# Patient Record
Sex: Female | Born: 1980 | Race: White | Hispanic: No | Marital: Married | Smoking: Never smoker
Health system: Southern US, Community
[De-identification: ages and names within clinical notes are randomized; demographics above are authoritative.]

## PROBLEM LIST (undated history)

## (undated) DIAGNOSIS — K219 Gastro-esophageal reflux disease without esophagitis: Secondary | ICD-10-CM

## (undated) DIAGNOSIS — R7989 Other specified abnormal findings of blood chemistry: Secondary | ICD-10-CM

## (undated) DIAGNOSIS — R030 Elevated blood-pressure reading, without diagnosis of hypertension: Secondary | ICD-10-CM

## (undated) DIAGNOSIS — G43909 Migraine, unspecified, not intractable, without status migrainosus: Secondary | ICD-10-CM

## (undated) DIAGNOSIS — G4700 Insomnia, unspecified: Secondary | ICD-10-CM

## (undated) DIAGNOSIS — F988 Other specified behavioral and emotional disorders with onset usually occurring in childhood and adolescence: Secondary | ICD-10-CM

---

## 2001-08-06 ENCOUNTER — Other Ambulatory Visit: Admission: RE | Admit: 2001-08-06 | Discharge: 2001-08-06 | Payer: Self-pay | Admitting: *Deleted

## 2002-03-06 ENCOUNTER — Emergency Department (HOSPITAL_COMMUNITY): Admission: EM | Admit: 2002-03-06 | Discharge: 2002-03-06 | Payer: Self-pay | Admitting: Emergency Medicine

## 2002-09-27 ENCOUNTER — Inpatient Hospital Stay (HOSPITAL_COMMUNITY): Admission: AD | Admit: 2002-09-27 | Discharge: 2002-09-27 | Payer: Self-pay | Admitting: Obstetrics & Gynecology

## 2002-10-07 ENCOUNTER — Other Ambulatory Visit: Admission: RE | Admit: 2002-10-07 | Discharge: 2002-10-07 | Payer: Self-pay | Admitting: Obstetrics and Gynecology

## 2002-12-10 ENCOUNTER — Encounter: Admission: RE | Admit: 2002-12-10 | Discharge: 2003-03-10 | Payer: Self-pay | Admitting: Obstetrics and Gynecology

## 2003-03-04 ENCOUNTER — Inpatient Hospital Stay (HOSPITAL_COMMUNITY): Admission: AD | Admit: 2003-03-04 | Discharge: 2003-03-04 | Payer: Self-pay | Admitting: Obstetrics and Gynecology

## 2003-03-05 ENCOUNTER — Inpatient Hospital Stay (HOSPITAL_COMMUNITY): Admission: AD | Admit: 2003-03-05 | Discharge: 2003-03-05 | Payer: Self-pay | Admitting: Obstetrics & Gynecology

## 2003-03-10 ENCOUNTER — Inpatient Hospital Stay (HOSPITAL_COMMUNITY): Admission: AD | Admit: 2003-03-10 | Discharge: 2003-03-12 | Payer: Self-pay | Admitting: Obstetrics and Gynecology

## 2003-03-11 ENCOUNTER — Encounter: Payer: Self-pay | Admitting: Obstetrics and Gynecology

## 2003-04-06 ENCOUNTER — Encounter: Payer: Self-pay | Admitting: Obstetrics and Gynecology

## 2003-04-06 ENCOUNTER — Inpatient Hospital Stay (HOSPITAL_COMMUNITY): Admission: AD | Admit: 2003-04-06 | Discharge: 2003-04-06 | Payer: Self-pay | Admitting: Obstetrics and Gynecology

## 2003-05-03 ENCOUNTER — Inpatient Hospital Stay (HOSPITAL_COMMUNITY): Admission: AD | Admit: 2003-05-03 | Discharge: 2003-05-05 | Payer: Self-pay | Admitting: Obstetrics and Gynecology

## 2003-05-03 ENCOUNTER — Encounter (INDEPENDENT_AMBULATORY_CARE_PROVIDER_SITE_OTHER): Payer: Self-pay | Admitting: Specialist

## 2003-05-06 ENCOUNTER — Encounter: Admission: RE | Admit: 2003-05-06 | Discharge: 2003-06-05 | Payer: Self-pay | Admitting: Obstetrics and Gynecology

## 2003-06-30 ENCOUNTER — Other Ambulatory Visit: Admission: RE | Admit: 2003-06-30 | Discharge: 2003-06-30 | Payer: Self-pay | Admitting: Obstetrics and Gynecology

## 2004-08-25 ENCOUNTER — Encounter: Admission: RE | Admit: 2004-08-25 | Discharge: 2004-08-25 | Payer: Self-pay | Admitting: Obstetrics and Gynecology

## 2005-03-21 ENCOUNTER — Ambulatory Visit: Payer: Self-pay | Admitting: Family Medicine

## 2005-04-06 ENCOUNTER — Ambulatory Visit: Payer: Self-pay | Admitting: Family Medicine

## 2005-05-09 ENCOUNTER — Ambulatory Visit: Payer: Self-pay | Admitting: Family Medicine

## 2005-12-14 ENCOUNTER — Emergency Department (HOSPITAL_COMMUNITY): Admission: EM | Admit: 2005-12-14 | Discharge: 2005-12-14 | Payer: Self-pay | Admitting: Emergency Medicine

## 2006-03-05 ENCOUNTER — Ambulatory Visit: Payer: Self-pay | Admitting: Family Medicine
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
## 2006-12-24 ENCOUNTER — Ambulatory Visit: Payer: Self-pay | Admitting: Family Medicine

## 2006-12-24 DIAGNOSIS — J029 Acute pharyngitis, unspecified: Secondary | ICD-10-CM

## 2007-02-18 ENCOUNTER — Ambulatory Visit: Payer: Self-pay | Admitting: Internal Medicine

## 2007-10-10 ENCOUNTER — Ambulatory Visit: Payer: Self-pay | Admitting: Family Medicine

## 2009-06-25 ENCOUNTER — Ambulatory Visit: Payer: Self-pay | Admitting: Family Medicine

## 2009-09-14 ENCOUNTER — Telehealth: Payer: Self-pay | Admitting: Family Medicine

## 2010-07-09 ENCOUNTER — Inpatient Hospital Stay (HOSPITAL_COMMUNITY): Admission: AD | Admit: 2010-07-09 | Discharge: 2010-07-13 | Payer: Self-pay | Admitting: Obstetrics and Gynecology

## 2010-07-11 ENCOUNTER — Encounter (INDEPENDENT_AMBULATORY_CARE_PROVIDER_SITE_OTHER): Payer: Self-pay | Admitting: Obstetrics and Gynecology

## 2010-09-20 NOTE — Progress Notes (Signed)
Summary: Sick  Phone Note Call from Patient Call back at Home Phone [phone]   Caller: Patient Call For: [name] Summary of Call: Patient called to let Dr. Milinda Antis know that she has been having a headache, vomiting, having sinus trouble, no fever, no shortness of breath, no wheezing, no cough,  no diarrhea.  Says that this has been going on for several days and wanted to know if she should be seen or if something could be called in for her.  Also wanted to let Dr. Milinda Antis know that her and her husband have been trying to get pregnant and are not having any luck.  [facility]ses [facility].  Please advise Initial call taken by: Linde Gillis CMA Duncan Dull),  September 14, 2009 11:21 AM  Follow-up for Phone Call        needs to follow up with first availible (if severe headache or fever after hours go to ER)  I recommend tylenol as needed and clear fluids and rest  nasal saline spray for nasal congestion  Follow-up by: [name],  September 14, 2009 1:40 PM  Additional Follow-up for Phone Call Additional follow up Details #1::        Advised pt, she will call back tomorrow if not better. Additional Follow-up by: Lowella Petties CMA,  September 14, 2009 2:37 PM

## 2010-11-01 ENCOUNTER — Encounter: Payer: Self-pay | Admitting: Family Medicine

## 2010-11-01 ENCOUNTER — Ambulatory Visit (INDEPENDENT_AMBULATORY_CARE_PROVIDER_SITE_OTHER): Payer: 59 | Admitting: Family Medicine

## 2010-11-01 DIAGNOSIS — J029 Acute pharyngitis, unspecified: Secondary | ICD-10-CM

## 2010-11-01 LAB — CBC
HCT: 21.9 % — ABNORMAL LOW (ref 36.0–46.0)
HCT: 30.8 % — ABNORMAL LOW (ref 36.0–46.0)
Hemoglobin: 7.6 g/dL — ABNORMAL LOW (ref 12.0–15.0)
MCHC: 35.3 g/dL (ref 30.0–36.0)
MCV: 86.6 fL (ref 78.0–100.0)
MCV: 87.1 fL (ref 78.0–100.0)
RDW: 15 % (ref 11.5–15.5)
RDW: 15.6 % — ABNORMAL HIGH (ref 11.5–15.5)
WBC: 9.2 10*3/uL (ref 4.0–10.5)

## 2010-11-01 LAB — RH IMMUNE GLOB WKUP(>/=20WKS)(NOT WOMEN'S HOSP): Unit division: 0

## 2010-11-08 NOTE — Letter (Signed)
Summary: Out of Work  [employer] at [facility]  [address] [place], [place] [address]   Phone: [phone]  Fax: [phone]    November 01, 2010   Employee:  Geneva K Montanaro    To Whom It May Concern:   For Medical reasons, please excuse the above named employee from work for the following dates:  Start:   today  End:   go back to work on 3/15 if no fever, potentially contagious  If you need additional information, please feel free to contact our office.         Sincerely,    Crawford Givens MD

## 2010-11-08 NOTE — Assessment & Plan Note (Signed)
Summary: ST/CLE   [facility]   Vital Signs:  Patient profile:   30 year old female Height:      65 inches Weight:      201.25 pounds BMI:     33.61 Temp:     98.6 degrees F oral Pulse rate:   88 / minute Pulse rhythm:   regular BP sitting:   112 / 70  (left arm) Cuff size:   regular  Vitals Entered By: Delilah Shan CMA Takoda Janowiak Dull) (November 01, 2010 2:49 PM) CC: ST   History of Present Illness: Sat PM with ST.  Worse since then.  ?fevers, has felt hot.  Minimal cough.  Mild stuffy nose.   H/o strep.   Allergies: No Known Drug Allergies  Social History: 2 sons married Hotel manager  Review of Systems       See HPI.  Otherwise negative.    Physical Exam  General:  GEN: nad, alert and oriented HEENT: mucous membranes moist, TM w/o erythema, nasal epithelium injected, OP with cobblestoning and erythema NECK: supple w/ LA that is tender CV: rrr. PULM: ctab, no inc wob ABD: soft, +bs EXT: no edema    Impression & Recommendations:  Problem # 1:  PHARYNGITIS, ACUTE (ICD-462)  RST pos.  treat with amoxil.  note for work.  supportive tx o/w.  nontoxic.  follow up as needed.   Her updated medication list for this problem includes:    Amoxicillin 875 Mg Tabs (Amoxicillin) .Marland Kitchen... 1 by mouth two times a day  Complete Medication List: 1)  Amoxicillin 875 Mg Tabs (Amoxicillin) .Marland Kitchen.. 1 by mouth two times a day  Other Orders: Rapid Strep (91478)  Patient Instructions: 1)  Get plenty of rest, drink lots of clear liquids, and use Tylenol for fever and comfort.  Start the antibiotics today.  Take care.  Prescriptions: AMOXICILLIN 875 MG TABS (AMOXICILLIN) 1 by mouth two times a day  #20 x 0   Entered and Authorized by:   Crawford Givens MD   Signed by:   Crawford Givens MD on 11/01/2010   Method used:   Electronically to        [facility]* (retail)       [address]       [place], [place]  [address]       Ph: [phone]       Fax: [phone]   RxID:    [id number]    Orders Added: 1)  Rapid Strep [34742] 2)  Est. Patient Level III [59563]    Prior Medications (reviewed today): None Current Allergies (reviewed today): No known allergies   Laboratory Results  Date/Time Received: November 01, 2010 2:52 PM   Other Tests  Rapid Strep: positive

## 2011-01-06 NOTE — Discharge Summary (Signed)
[name], [name]                       ACCOUNT NO.:  [id number]   MEDICAL RECORD NO.:  [id number]                   PATIENT TYPE:  INP   LOCATION:  9153                                 FACILITY:  [facility]   PHYSICIAN:  Maxie Better, M.D.            DATE OF BIRTH:  [birth year]   DATE OF ADMISSION:  03/10/2003  DATE OF DISCHARGE:  03/12/2003                                 DISCHARGE SUMMARY   ADMISSION DIAGNOSES:  1. Preterm labor, unresponsive to oral tocolysis.  2. Intrauterine gestation at 29-4/7 weeks.  3. Fetal fibronectin positive.  4. Rh negative.   DISCHARGE DIAGNOSES:  1. Preterm labor, controlled.  2. Intrauterine gestation at 29-6/7 weeks.  3. Rh negative.  4. Fetal fibronectin positive.   HISTORY OF PRESENT ILLNESS:  A 30 year old gravida 1, para 0, female, at 42-  4/7 weeks, admitted for uncontrollable preterm labor.  The patient has been  on Procardia for preterm labor.  The patient was thought to have  contractions every three minutes.  Subcu terbutaline was given with  resolution of the contractions for about an hour, and then the contractions  resumed.  Decision was made to hospitalize the patient for magnesium sulfate  tocolysis.  Her fetal fibronectin was positive on March 02, 2003.  She  received betamethasone on July 13 and March 04, 2003.  Her last exam in the  office which was on the day of admission was 1, 50%, -2, vertex.  The  patient is a nonsmoker.  She is Rh negative, and had received RhoGAM.  The  glucose challenge test had been normal.  AFP __________ test was normal.   HOSPITAL COURSE:  The patient was admitted.  She had a reactive non-stress  test, contracting every two to three minutes.  Group B Strep culture was  performed.  The patient was started on magnesium sulfate.  She was started  on ampicillin pending Group B Strep culture status.  The Group B Strep  culture was subsequently negative.  The ampicillin was therefore  discontinued.   Motrin was added to the tocolysis regimen due to persistent  contractions despite the magnesium sulfate.  On hospital day #2, the patient  was complaining of double vision.  Magnesium level was obtained, which was  6.4.  The magnesium was put on hold.  Terbutaline pump was subsequently  started.  Ultrasound was obtained on March 11, 2003, that revealed a cervical  length of 3.7, amniotic fluid index was 13.8, estimated fetal weight of 1421  g, which was between the 50th and 75th percentile.  On March 12, 2003, the  patient's repeat examination showed her cervix had remained unchanged, she  had a reactive non-stress test with some small variables, no contractions  seen, and appeared to be responding to the terbutaline pump, at which time a  decision was made to discharge the patient.   DISPOSITION:  Home.   CONDITION:  Stable.  FOLLOWUP:  Follow up appointment on March 19, 2003.   DISCHARGE MEDICATIONS:  1. Terbutaline pump, management per [facility].  2. Prenatal vitamins.   DISCHARGE INSTRUCTIONS:  1. Preterm labor precautions were given.  2. Call for leakage of fluid, vaginal bleeding, or decreased fetal movement.                                               Maxie Better, M.D.    Palmer/MEDQ  D:  04/05/2003  T:  04/05/2003  Job:  [id number]

## 2012-09-02 ENCOUNTER — Encounter: Payer: Self-pay | Admitting: Family Medicine

## 2012-09-02 ENCOUNTER — Ambulatory Visit (INDEPENDENT_AMBULATORY_CARE_PROVIDER_SITE_OTHER): Payer: 59 | Admitting: Family Medicine

## 2012-09-02 VITALS — BP 98/64 | HR 84 | Temp 98.2°F | Ht 67.0 in | Wt 218.5 lb

## 2012-09-02 DIAGNOSIS — M65839 Other synovitis and tenosynovitis, unspecified forearm: Secondary | ICD-10-CM

## 2012-09-02 DIAGNOSIS — G4700 Insomnia, unspecified: Secondary | ICD-10-CM | POA: Insufficient documentation

## 2012-09-02 DIAGNOSIS — M778 Other enthesopathies, not elsewhere classified: Secondary | ICD-10-CM | POA: Insufficient documentation

## 2012-09-02 NOTE — Progress Notes (Signed)
Subjective:    Patient ID: [name], female    DOB: May 10, 1981, 32 y.o.   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 - turned over a spatula and had severe pain - ever since - having limited rom - with lifting primarily  Cannot pick up her 33 year old or her gun   Hurts on radial side  No swelling  No redness or rash  Hurts more to flex than extend,, and to tilt medially  Pronation and supination really hurts  Sharp pain, not burining  When at rest throbs on and off   No numbness  Does feel somewhat weak Neck is fine   No trauma  She does sit at a desk  Does a lot of typing and writing   In general also having trouble relaxing and sleeping - staying asleep is the big issue  Cannot calm down  Gets up at 5 am  No regular exercise right now due to her hours - and kids   Melatonin helps just a little  Has not tried benadryl or volarian     Patient Active Problem List  Diagnosis  (none) - all problems resolved or deleted   No past medical history on file. No past surgical history on file. History  Substance Use Topics  . Smoking status: Never Smoker   . Smokeless tobacco: Not on file  . Alcohol Use: Yes     Comment: occasional    No family history on file. No Known Allergies No current outpatient prescriptions on file prior to visit.        Review of Systems Review of Systems  Constitutional: Negative for fever, appetite change, fatigue and unexpected weight change.  Eyes: Negative for pain and visual disturbance.  Respiratory: Negative for cough and shortness of breath.   Cardiovascular: Negative for cp or palpitations    Gastrointestinal: Negative for nausea, diarrhea and constipation.  Genitourinary: Negative for urgency and frequency.  Skin: Negative for pallor or rash   MSK pos for wrist and hand pain  Neurological: Negative for weakness, light-headedness, numbness and headaches. pos for difficulty  sleeping  Hematological: Negative for adenopathy. Does not bruise/bleed easily.  Psychiatric/Behavioral: Negative for dysphoric mood. The patient is not nervous/anxious.  pos for stressors        Objective:   Physical Exam  Constitutional: She appears well-developed and well-nourished. No distress.  HENT:  Head: Normocephalic and atraumatic.  Eyes: Conjunctivae normal and EOM are normal. Pupils are equal, round, and reactive to light.  Neck: Neck supple.       No bony tenderness  Cardiovascular: Regular rhythm.   Pulmonary/Chest: Effort normal and breath sounds normal.  Musculoskeletal: She exhibits tenderness. She exhibits no edema.       Right wrist: She exhibits decreased range of motion and tenderness. She exhibits no bony tenderness, no swelling, no effusion, no crepitus, no deformity and no laceration.       R wrist- tender laterally  Worst pain to pronate/ supinate and flex wrist  No swelling/ bruising or skin change Nl perf and sensation  Nl grip with discomfort   Lymphadenopathy:    She has no cervical adenopathy.  Neurological: She is alert. She has normal strength and normal reflexes. She displays no atrophy. No sensory deficit. She exhibits normal muscle tone.  Skin: Skin is warm and dry. No rash noted. No erythema. No pallor.  Psychiatric: She has a normal mood and  affect.          Assessment & Plan:

## 2012-09-02 NOTE — Patient Instructions (Addendum)
Wear your wrist splint as much as you can For the next 2 weeks - take 2 aleve in am and 2 aleve in pm with meals (not on an empty stomach)- if the aleve bothers your stomach- stop it and call  Use ice on wrist twice daily for 10 minutes If no improvement in 2 weeks -call  For sleep try benadryl 25-50 mg about an hour before bed  volarian is an herbal supplement that you can also try am and pm for relaxation  Follow up if no improvement

## 2012-09-05 NOTE — Assessment & Plan Note (Signed)
Likely having to do with stressors and a bit of anxiety  Recommend first trying benadryl or volarian  If no success will disc px med Disc sleep hygeine and good health habits as well

## 2012-09-05 NOTE — Assessment & Plan Note (Signed)
Suspect due to injury or repetitive movement  Given wrist splint Disc cold compress/ relative rest and nsaids Will update if no imp and eval further

## 2013-09-05 ENCOUNTER — Encounter: Payer: Self-pay | Admitting: Family Medicine

## 2013-09-05 ENCOUNTER — Ambulatory Visit (INDEPENDENT_AMBULATORY_CARE_PROVIDER_SITE_OTHER): Payer: 59 | Admitting: Family Medicine

## 2013-09-05 VITALS — BP 102/66 | HR 65 | Temp 98.9°F | Ht 67.0 in | Wt 220.2 lb

## 2013-09-05 DIAGNOSIS — G4700 Insomnia, unspecified: Secondary | ICD-10-CM

## 2013-09-05 DIAGNOSIS — E079 Disorder of thyroid, unspecified: Secondary | ICD-10-CM

## 2013-09-05 MED ORDER — ZOLPIDEM TARTRATE 10 MG PO TABS: 10.0000 mg | ORAL_TABLET | Freq: Every evening | ORAL | Status: DC | PRN

## 2013-09-05 NOTE — Progress Notes (Signed)
Subjective:    Patient ID: [name], female    DOB: [birth year], 33 y.o.   MRN: [id number]  HPI Here for elevated T3 done by her gyn office  ? tsh and free T 4   No thyroid problems before   She has not felt hyped up  She sweats easily-nl for her  No more jittery than usual   She cannot sleep  Is really tired all the time  Never feels rested   Goes to bed by 9 -- then wakes up every hour on the hour - sometimes she can fall back to sleep and sometimes she cannot go back to sleep at all (she never gets out of bed because she knows she will not be able to get back to sleep at all Is a light sleeper  No snoring or apnea problem  Does not seem like stress or anxiety are a big problem  Denies depression   Irritable during the day   Does exercise regularly  No caffeine -she quit it   Takes melatonin  Also otc sleep aid / antihistamine Sleepy time tea  Tried volarian root-no improvement  She has had this problem for as long as she can remember - worse after having children   Patient Active Problem List   Diagnosis Date Noted  . Tendonitis of wrist, right 09/02/2012  . Insomnia 09/02/2012   No past medical history on file. No past surgical history on file. History  Substance Use Topics  . Smoking status: Never Smoker   . Smokeless tobacco: Not on file  . Alcohol Use: Yes     Comment: occasional    No family history on file. No Known Allergies No current outpatient prescriptions on file prior to visit.   No current facility-administered medications on file prior to visit.     Review of Systems Review of Systems  Constitutional: Negative for fever, appetite change, fatigue and unexpected weight change.  Eyes: Negative for pain and visual disturbance.  Respiratory: Negative for cough and shortness of breath.  neg for pedal edema Cardiovascular: Negative for cp or palpitations    Gastrointestinal: Negative for nausea, diarrhea and constipation.    Genitourinary: Negative for urgency and frequency.  Skin: Negative for pallor or rash   Neurological: Negative for weakness, light-headedness, numbness and headaches.  Hematological: Negative for adenopathy. Does not bruise/bleed easily.  Psychiatric/Behavioral: Negative for dysphoric mood. The patient is somewhat anxious/ high strung, pos for sleep disorder          Objective:   Physical Exam  Constitutional: She appears well-developed and well-nourished. No distress.  HENT:  Head: Normocephalic and atraumatic.  Mouth/Throat: Oropharynx is clear and moist.  Eyes: Conjunctivae and EOM are normal. Pupils are equal, round, and reactive to light. No scleral icterus.  Neck: Normal range of motion. Neck supple. No JVD present. Carotid bruit is not present.  Cardiovascular: Normal rate, regular rhythm and intact distal pulses.  Exam reveals no gallop.   Pulmonary/Chest: Effort normal and breath sounds normal. No respiratory distress. She has no wheezes. She has no rales.  Abdominal: She exhibits no abdominal bruit.  Musculoskeletal: She exhibits no edema.  Lymphadenopathy:    She has no cervical adenopathy.  Neurological: She is alert. She has normal reflexes. No cranial nerve deficit. She exhibits normal muscle tone. Coordination normal.  No tremor    Skin: Skin is warm and dry. No rash noted.  Psychiatric: She has a normal mood and affect.  Assessment & Plan:

## 2013-09-05 NOTE — Patient Instructions (Signed)
Thyroid panel today - will get results and make a plan  Try ambien 10 mg   (if too strong cut it in 1/2) - and make sure to take it as you are going bed  Watch out for any strange night time behavior  Let me know how it goes

## 2013-09-05 NOTE — Progress Notes (Signed)
Pre-visit discussion using our clinic review tool. No additional management support is needed unless otherwise documented below in the visit note.  

## 2013-09-06 LAB — TSH: TSH: 3.397 u[IU]/mL (ref 0.350–4.500)

## 2013-09-06 LAB — T3 UPTAKE: T3 UPTAKE: 23.9 % (ref 22.5–37.0)

## 2013-09-06 LAB — T4, FREE: FREE T4: 1.31 ng/dL (ref 0.80–1.80)

## 2013-09-06 LAB — T3, FREE: T3 FREE: 3.4 pg/mL (ref 2.3–4.2)

## 2013-09-07 NOTE — Assessment & Plan Note (Signed)
Trial of ambien 10 mg to help sleep (if she still awakens with this we can change to the [employer]ard[employer]ien cr) Also disc the poss of silenor  Pt aware of poss side eff incl habit and also night time behavior issues  Will update  Has very good sleep hygiene Some stressors-offered counseling

## 2013-09-07 NOTE — Assessment & Plan Note (Signed)
Re check labs today-thyroid profile  Pt tends to be on the anxious side -that is her baseline  Nl exam  Pending results

## 2013-09-09 ENCOUNTER — Encounter: Payer: Self-pay | Admitting: *Deleted

## 2013-11-01 ENCOUNTER — Other Ambulatory Visit: Payer: Self-pay | Admitting: Family Medicine

## 2013-11-03 NOTE — Telephone Encounter (Signed)
Px written for call in   

## 2013-11-03 NOTE — Telephone Encounter (Signed)
Electronic refill request, please advise  

## 2013-11-03 NOTE — Telephone Encounter (Signed)
Rx called in as prescribed 

## 2013-12-30 ENCOUNTER — Ambulatory Visit (INDEPENDENT_AMBULATORY_CARE_PROVIDER_SITE_OTHER): Payer: 59 | Admitting: Podiatry

## 2013-12-30 ENCOUNTER — Encounter: Payer: Self-pay | Admitting: Podiatry

## 2013-12-30 VITALS — BP 121/68 | HR 89 | Resp 16

## 2013-12-30 DIAGNOSIS — M722 Plantar fascial fibromatosis: Secondary | ICD-10-CM

## 2013-12-30 MED ORDER — MELOXICAM 15 MG PO TABS: 15.0000 mg | ORAL_TABLET | Freq: Every day | ORAL | Status: DC

## 2013-12-30 MED ORDER — METHYLPREDNISOLONE (PAK) 4 MG PO TABS: ORAL_TABLET | ORAL | Status: DC

## 2013-12-30 NOTE — Progress Notes (Signed)
Followup of plantar fasciitis right states it is really not gotten: Better.  Objective: Vital signs are stable she is alert and oriented x3 pain on palpation medial continued tubercle of the right heel.  Assessment: Pain in limb secondary to plantar fasciitis right.  Plan: Discussed etiology pathology conservative versus surgical therapies. Her Sterapred Dosepak to be followed by medic injected the right heel once again. She has a night splint and she's aware. And we discussed appropriate shoe gear.

## 2013-12-30 NOTE — Patient Instructions (Signed)

## 2014-04-30 ENCOUNTER — Other Ambulatory Visit: Payer: Self-pay | Admitting: Family Medicine

## 2014-04-30 NOTE — Telephone Encounter (Signed)
Last filled 11/03/13

## 2014-04-30 NOTE — Telephone Encounter (Signed)
Px written for call in   

## 2014-04-30 NOTE — Telephone Encounter (Signed)
Rx called in as prescribed 

## 2014-06-11 ENCOUNTER — Other Ambulatory Visit: Payer: Self-pay | Admitting: Podiatry

## 2014-06-18 ENCOUNTER — Encounter: Payer: Self-pay | Admitting: Podiatry

## 2014-06-18 ENCOUNTER — Ambulatory Visit (INDEPENDENT_AMBULATORY_CARE_PROVIDER_SITE_OTHER): Payer: 59 | Admitting: Podiatry

## 2014-06-18 VITALS — BP 132/85 | HR 88 | Resp 16

## 2014-06-18 DIAGNOSIS — M722 Plantar fascial fibromatosis: Secondary | ICD-10-CM

## 2014-06-18 NOTE — Progress Notes (Signed)
She presents today for follow-up of a painful right heel. She states that she was doing quite well for a period of time and then the right heel began to hurt again. She denies any trauma to the foot.  Objective: Vital signs are stable she is alert and oriented 3. She has pain on palpation medial calcaneal tubercle of the right heel with palpable pulses and no calf pain.  Assessment: Chronic intractable plantar fasciitis right foot.  Plan: Discussed etiology pathology conserve her surgical therapies reinjected the right heel today. She will continue all other conservative therapies we discussed the possible need for injection.

## 2014-08-28 ENCOUNTER — Other Ambulatory Visit: Payer: Self-pay | Admitting: Family Medicine

## 2014-08-31 NOTE — Telephone Encounter (Signed)
Rx called in as prescribed (check with pharmacy and they did have Rx), left voicemail letting pt know she needs to schedule a f/u with Dr. Milinda Antisower

## 2014-08-31 NOTE — Telephone Encounter (Signed)
Please schedule f/u Will refill times one -please call in  If her pharmacy does not have it - [facility] [address]   [facility] order)

## 2014-08-31 NOTE — Telephone Encounter (Signed)
Received refill electronically from pharmacy. Last refill 04/30/14 #30/3, last office visit 09/05/13. Is it okay to refill medication?

## 2014-09-02 ENCOUNTER — Ambulatory Visit (INDEPENDENT_AMBULATORY_CARE_PROVIDER_SITE_OTHER): Payer: 59 | Admitting: Family Medicine

## 2014-09-02 ENCOUNTER — Encounter: Payer: Self-pay | Admitting: Family Medicine

## 2014-09-02 VITALS — BP 110/62 | HR 93 | Temp 99.2°F | Ht 67.0 in | Wt 238.5 lb

## 2014-09-02 DIAGNOSIS — G4700 Insomnia, unspecified: Secondary | ICD-10-CM

## 2014-09-02 DIAGNOSIS — F988 Other specified behavioral and emotional disorders with onset usually occurring in childhood and adolescence: Secondary | ICD-10-CM

## 2014-09-02 DIAGNOSIS — F909 Attention-deficit hyperactivity disorder, unspecified type: Secondary | ICD-10-CM

## 2014-09-02 NOTE — Progress Notes (Signed)
Subjective:    Patient ID: [name], female    DOB: 12-01-1980, 34 y.o.   MRN: [id number]  HPI Here for f/u of insomnia  Works well for her   Is getting much better sleep  Generally takes it every day - occ does not need it   No side effects  No dizziness/ no sleepiness the next am  Gets at least 8 hours of sleep per night   No wandering at night or strange behavior   Mood has been good as well   Just started adderall for ADD - was dx by Dr [place] SinkBraden - (behavioral health) - did not test her because she was tested as a child  She uses it for work now - has to focus a lot at work  It is helping - she is more focused and mellow  Not affecting sleep -takes it early in the day    Had her last pap in Dec  On birth control  Did get her flu shot in the fall   Patient Active Problem List   Diagnosis Date Noted  . Thyroid disorder 09/05/2013  . Tendonitis of wrist, right 09/02/2012  . Insomnia 09/02/2012   No past medical history on file. No past surgical history on file. History  Substance Use Topics  . Smoking status: Never Smoker   . Smokeless tobacco: Never Used  . Alcohol Use: 0.0 oz/week    0 Not specified per week     Comment: occasional    No family history on file. No Known Allergies Current Outpatient Prescriptions on File Prior to Visit  Medication Sig Dispense Refill  . levonorgestrel-ethinyl estradiol (SEASONALE,INTROVALE,JOLESSA) 0.15-0.03 MG tablet Take 1 tablet by mouth daily.    . meloxicam (MOBIC) 15 MG tablet TAKE 1 TABLET (15 MG TOTAL) BY MOUTH DAILY. 30 tablet 3  . zolpidem (AMBIEN) 10 MG tablet TAKE 1 TABLET BY MOUTH AT BEDTIME AS NEEDED FOR SLEEP 30 tablet 0   No current facility-administered medications on file prior to visit.     Review of Systems    Review of Systems  Constitutional: Negative for fever, appetite change, fatigue and unexpected weight change.  Eyes: Negative for pain and visual disturbance.  Respiratory: Negative for  cough and shortness of breath.   Cardiovascular: Negative for cp or palpitations    Gastrointestinal: Negative for nausea, diarrhea and constipation.  Genitourinary: Negative for urgency and frequency.  Skin: Negative for pallor or rash   Neurological: Negative for weakness, light-headedness, numbness and headaches.  Hematological: Negative for adenopathy. Does not bruise/bleed easily.  Psychiatric/Behavioral: Negative for dysphoric mood. The patient is not nervous/anxious.      Objective:   Physical Exam  Constitutional: She appears well-developed and well-nourished. No distress.  obese and well appearing   HENT:  Head: Normocephalic and atraumatic.  Eyes: Conjunctivae and EOM are normal. Pupils are equal, round, and reactive to light. No scleral icterus.  Neck: Normal range of motion. Neck supple. Carotid bruit is not present. No thyromegaly present.  Cardiovascular: Normal rate, regular rhythm and normal heart sounds.   Pulmonary/Chest: Effort normal and breath sounds normal. No respiratory distress. She has no wheezes. She has no rales.  Lymphadenopathy:    She has no cervical adenopathy.  Neurological: She is alert. She has normal reflexes. No cranial nerve deficit. She exhibits normal muscle tone. Coordination normal.  Skin: Skin is warm and dry. No rash noted. No erythema. No pallor.  Psychiatric: She has a normal mood  and affect.          Assessment & Plan:   Problem List Items Addressed This Visit      Other   Insomnia - Primary    Doing well with ambien 10 mg -no side effects or problems  Also sleep hygeine  Enc exercise and healthy diet for wt loss  Will continue to refill

## 2014-09-02 NOTE — Patient Instructions (Signed)
Call your pharmacy and make sure your [name] is waiting - call us if any problems (if it is on back order also let us know)  Take care of yourself  Think about exercise program-that helps sleep as well

## 2014-09-02 NOTE — Progress Notes (Signed)
Pre visit review using our clinic review tool, if applicable. No additional management support is needed unless otherwise documented below in the visit note. 

## 2014-09-03 DIAGNOSIS — F988 Other specified behavioral and emotional disorders with onset usually occurring in childhood and adolescence: Secondary | ICD-10-CM | POA: Insufficient documentation

## 2014-09-03 NOTE — Assessment & Plan Note (Signed)
Doing well with ambien 10 mg -no side effects or problems  Also sleep hygeine  Enc exercise and healthy diet for wt loss  Will continue to refill

## 2014-10-02 ENCOUNTER — Other Ambulatory Visit: Payer: Self-pay | Admitting: Family Medicine

## 2014-10-02 NOTE — Telephone Encounter (Signed)
rx has been called in to [facility].

## 2014-10-02 NOTE — Telephone Encounter (Signed)
Last filled 08/31/14--please advise

## 2014-10-02 NOTE — Telephone Encounter (Signed)
Px written for call in   

## 2014-10-28 ENCOUNTER — Other Ambulatory Visit: Payer: Self-pay | Admitting: Podiatry

## 2014-12-11 ENCOUNTER — Ambulatory Visit (INDEPENDENT_AMBULATORY_CARE_PROVIDER_SITE_OTHER): Payer: 59 | Admitting: Family Medicine

## 2014-12-11 ENCOUNTER — Encounter: Payer: Self-pay | Admitting: Family Medicine

## 2014-12-11 VITALS — BP 112/64 | HR 76 | Temp 98.7°F | Ht 67.0 in | Wt 242.5 lb

## 2014-12-11 DIAGNOSIS — F988 Other specified behavioral and emotional disorders with onset usually occurring in childhood and adolescence: Secondary | ICD-10-CM

## 2014-12-11 DIAGNOSIS — F909 Attention-deficit hyperactivity disorder, unspecified type: Secondary | ICD-10-CM | POA: Diagnosis not present

## 2014-12-11 MED ORDER — AMPHETAMINE-DEXTROAMPHETAMINE 10 MG PO TABS: ORAL_TABLET | ORAL | Status: DC

## 2014-12-11 NOTE — Assessment & Plan Note (Signed)
Will take over px her ADD med since she is stable  Disc her hx in detail  adderall is helpful w/o side effects  Px today Rev protocol for refills Will call if issues

## 2014-12-11 NOTE — Progress Notes (Signed)
Subjective:    Patient ID: Jaclyn MarseilleHeather Ayoub, female    DOB: 1981-07-22, 34 y.o.   MRN: [id number]  HPI Here to discuss tx of ADD  Was seeing a psychiatrist - Dr [place] SinkBraden  They stopped taking her insurance   Has been on medicine lifelong  Had ADD since young child in elementry school   adderall 10 mg in am and 1 after lunch  It does work well for her  A world of difference at work -- she is focused and in the moment and not distractible - her co workers have noticed a big difference  Works for the [employer]  She is also a Therapist, artbig planner - tends to stay organized and make lists  Always has things put out for the next day    Has not been on any other medicines   Takes Remus Lofflerambien works well for insomnia (that she had before she started adderall)   Son has ADD as well - just started low dose as well   Patient Active Problem List   Diagnosis Date Noted  . ADD (attention deficit disorder) 09/03/2014  . Thyroid disorder 09/05/2013  . Tendonitis of wrist, right 09/02/2012  . Insomnia 09/02/2012   No past medical history on file. No past surgical history on file. History  Substance Use Topics  . Smoking status: Never Smoker   . Smokeless tobacco: Never Used  . Alcohol Use: 0.0 oz/week    0 Standard drinks or equivalent per week     Comment: occasional    No family history on file. No Known Allergies Current Outpatient Prescriptions on File Prior to Visit  Medication Sig Dispense Refill  . amphetamine-dextroamphetamine (ADDERALL) 10 MG tablet Take 10 mg by mouth 2 (two) times daily.  0  . levonorgestrel-ethinyl estradiol (SEASONALE,INTROVALE,JOLESSA) 0.15-0.03 MG tablet Take 1 tablet by mouth daily.    . meloxicam (MOBIC) 15 MG tablet TAKE 1 TABLET (15 MG TOTAL) BY MOUTH DAILY. 30 tablet 3  . zolpidem (AMBIEN) 10 MG tablet TAKE 1 TABLET BY MOUTH AT BEDTIME AS NEEDED FOR SLEEP 30 tablet 5   No current facility-administered medications on file prior to visit.       Review of  Systems Review of Systems  Constitutional: Negative for fever, appetite change, fatigue and unexpected weight change.  Eyes: Negative for pain and visual disturbance.  Respiratory: Negative for cough and shortness of breath.   Cardiovascular: Negative for cp or palpitations    Gastrointestinal: Negative for nausea, diarrhea and constipation.  Genitourinary: Negative for urgency and frequency.  Skin: Negative for pallor or rash   Neurological: Negative for weakness, light-headedness, numbness and headaches.  Hematological: Negative for adenopathy. Does not bruise/bleed easily.  Psychiatric/Behavioral: Negative for dysphoric mood. The patient is not nervous/anxious.  pos for lack of attentiveness when not taking her ADD medication        Objective:   Physical Exam  Constitutional: She appears well-developed and well-nourished. No distress.  HENT:  Head: Normocephalic and atraumatic.  Mouth/Throat: Oropharynx is clear and moist.  Eyes: Conjunctivae and EOM are normal. Pupils are equal, round, and reactive to light.  Neck: Normal range of motion. Neck supple. No JVD present. Carotid bruit is not present. No thyromegaly present.  Cardiovascular: Normal rate, regular rhythm, normal heart sounds and intact distal pulses.  Exam reveals no gallop.   Pulmonary/Chest: Effort normal and breath sounds normal. No respiratory distress. She has no wheezes. She has no rales.  No crackles  Abdominal:  Soft. Bowel sounds are normal. She exhibits no distension, no abdominal bruit and no mass. There is no tenderness.  Musculoskeletal: She exhibits no edema.  Lymphadenopathy:    She has no cervical adenopathy.  Neurological: She is alert. She has normal reflexes. No cranial nerve deficit. She exhibits normal muscle tone. Coordination normal.  No tremor   Skin: Skin is warm and dry. No rash noted.  Psychiatric: She has a normal mood and affect.          Assessment & Plan:   Problem List Items  Addressed This Visit      Other   ADD (attention deficit disorder) - Primary    Will take over px her ADD med since she is stable  Disc her hx in detail  adderall is helpful w/o side effects  Px today Rev protocol for refills Will call if issues

## 2014-12-11 NOTE — Progress Notes (Signed)
Pre visit review using our clinic review tool, if applicable. No additional management support is needed unless otherwise documented below in the visit note. 

## 2014-12-11 NOTE — Patient Instructions (Signed)
Continue current dose of adderall for ADD Stay organized  Stay away from processed foods and a lot of sugar  Drink lots of water  Also get regular exercise

## 2015-01-12 ENCOUNTER — Other Ambulatory Visit: Payer: Self-pay

## 2015-01-12 NOTE — Telephone Encounter (Signed)
Pt left v/m requesting rx for Adderall. Call when ready for pick up. Pt last seen and rx printed 12/11/14.

## 2015-01-13 MED ORDER — AMPHETAMINE-DEXTROAMPHETAMINE 10 MG PO TABS: ORAL_TABLET | ORAL | Status: DC

## 2015-01-13 NOTE — Telephone Encounter (Signed)
Pt notified Rx ready for pickup 

## 2015-01-13 NOTE — Telephone Encounter (Signed)
Px printed for pick up in IN box  

## 2015-02-10 ENCOUNTER — Encounter: Payer: Self-pay | Admitting: Family Medicine

## 2015-02-19 ENCOUNTER — Other Ambulatory Visit: Payer: Self-pay

## 2015-02-19 NOTE — Telephone Encounter (Signed)
Pt left v/m requesting rx for Adderall. Call when ready for pick up. rx last printed # 60 on 01/13/15 and pt last seen 12/11/14.

## 2015-02-23 MED ORDER — AMPHETAMINE-DEXTROAMPHETAMINE 10 MG PO TABS: ORAL_TABLET | ORAL | Status: DC

## 2015-02-23 NOTE — Telephone Encounter (Signed)
Px printed for pick up in IN box  

## 2015-02-23 NOTE — Telephone Encounter (Signed)
Pt notified Rx ready for pickup 

## 2015-03-01 ENCOUNTER — Other Ambulatory Visit: Payer: Self-pay | Admitting: Podiatry

## 2015-03-16 ENCOUNTER — Encounter: Payer: Self-pay | Admitting: Podiatry

## 2015-03-16 ENCOUNTER — Ambulatory Visit (INDEPENDENT_AMBULATORY_CARE_PROVIDER_SITE_OTHER): Payer: Commercial Managed Care - HMO | Admitting: Podiatry

## 2015-03-16 VITALS — BP 137/79 | HR 90 | Resp 16

## 2015-03-16 DIAGNOSIS — M722 Plantar fascial fibromatosis: Secondary | ICD-10-CM

## 2015-03-16 NOTE — Progress Notes (Signed)
She presents today with a similar complaint of pain to her right heel. She hasn't been seen in several months and states that she was doing well until the last few weeks. She will be leaving on vacation for a cruise in the next few days and like for her foot feel better.  Objective: Vital signs are stable she is alert and oriented 3. She denies changes in her past medical history medications allergies surgery social history or review of systems. Pulses are strongly palpable bilateral. She has pain on palpation medial calcaneal tubercle of the right heel. No calf pain.  Assessment: Chronic intractable plantar fasciitis recurrent nature right foot.  Plan: Reinjected today with Kenalog and local and aesthetic no oral medication was provided. Follow-up with her as needed.

## 2015-04-07 ENCOUNTER — Other Ambulatory Visit: Payer: Self-pay | Admitting: *Deleted

## 2015-04-07 MED ORDER — ZOLPIDEM TARTRATE 10 MG PO TABS: 10.0000 mg | ORAL_TABLET | Freq: Every evening | ORAL | Status: DC | PRN

## 2015-04-07 NOTE — Telephone Encounter (Signed)
Px written for call in   

## 2015-04-07 NOTE — Telephone Encounter (Signed)
Electronic refill request, pt had OV on 12/11/14, last refilled on  10/02/14 #30 with 5 additional refills, please advise

## 2015-04-07 NOTE — Telephone Encounter (Signed)
Rx called in as prescribed 

## 2015-04-20 ENCOUNTER — Other Ambulatory Visit: Payer: Self-pay

## 2015-04-20 MED ORDER — AMPHETAMINE-DEXTROAMPHETAMINE 10 MG PO TABS: ORAL_TABLET | ORAL | Status: DC

## 2015-04-20 NOTE — Telephone Encounter (Signed)
Pt left v/m requesting rx for Adderall. Call when ready for pick up.rx last printed # 60 on 02/23/2015 and last seen 12/11/14.

## 2015-04-20 NOTE — Telephone Encounter (Signed)
Pt notified Rx ready for pickup 

## 2015-04-20 NOTE — Telephone Encounter (Signed)
Px printed for pick up in IN box  

## 2015-06-03 ENCOUNTER — Telehealth: Payer: Self-pay | Admitting: Family Medicine

## 2015-06-03 NOTE — Telephone Encounter (Signed)
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  Pt needs a letter or something verifying that Dr. Milinda Antisower prescribes Ritalin for her so when her most recent results come back she can have proof.

## 2015-06-04 NOTE — Telephone Encounter (Signed)
Letter for her job re: tx for ADD  In IN box

## 2015-06-04 NOTE — Telephone Encounter (Signed)
Pt notified letter ready for pick up. 

## 2015-07-13 ENCOUNTER — Other Ambulatory Visit: Payer: Self-pay

## 2015-07-13 MED ORDER — AMPHETAMINE-DEXTROAMPHETAMINE 10 MG PO TABS: ORAL_TABLET | ORAL | Status: DC

## 2015-07-13 NOTE — Telephone Encounter (Signed)
Left voicemail letting pt know Rx ready for pick up 

## 2015-07-13 NOTE — Telephone Encounter (Signed)
Pt left v/m requesting rx for Adderall. Call when ready for pick up. rx last printed #60 on 04/20/15. Last seen 12/11/14.

## 2015-07-13 NOTE — Telephone Encounter (Signed)
Px printed for pick up in IN box  

## 2015-10-11 ENCOUNTER — Ambulatory Visit (INDEPENDENT_AMBULATORY_CARE_PROVIDER_SITE_OTHER): Payer: 59 | Admitting: Family Medicine

## 2015-10-11 ENCOUNTER — Encounter: Payer: Self-pay | Admitting: Family Medicine

## 2015-10-11 VITALS — BP 132/84 | HR 80 | Temp 98.8°F | Ht 67.0 in | Wt 250.2 lb

## 2015-10-11 DIAGNOSIS — G43701 Chronic migraine without aura, not intractable, with status migrainosus: Secondary | ICD-10-CM

## 2015-10-11 DIAGNOSIS — G4700 Insomnia, unspecified: Secondary | ICD-10-CM

## 2015-10-11 DIAGNOSIS — Z8669 Personal history of other diseases of the nervous system and sense organs: Secondary | ICD-10-CM | POA: Insufficient documentation

## 2015-10-11 DIAGNOSIS — F909 Attention-deficit hyperactivity disorder, unspecified type: Secondary | ICD-10-CM

## 2015-10-11 DIAGNOSIS — F988 Other specified behavioral and emotional disorders with onset usually occurring in childhood and adolescence: Secondary | ICD-10-CM

## 2015-10-11 MED ORDER — AMPHETAMINE-DEXTROAMPHETAMINE 10 MG PO TABS: ORAL_TABLET | ORAL | Status: DC

## 2015-10-11 MED ORDER — SUMATRIPTAN SUCCINATE 100 MG PO TABS: 100.0000 mg | ORAL_TABLET | Freq: Once | ORAL | Status: DC

## 2015-10-11 MED ORDER — CYCLOBENZAPRINE HCL 10 MG PO TABS: 5.0000 mg | ORAL_TABLET | Freq: Three times a day (TID) | ORAL | Status: DC | PRN

## 2015-10-11 MED ORDER — ZOLPIDEM TARTRATE 10 MG PO TABS: 10.0000 mg | ORAL_TABLET | Freq: Every evening | ORAL | Status: DC | PRN

## 2015-10-11 NOTE — Progress Notes (Signed)
Subjective:    Patient ID: [name], female    DOB: 09/05/1980, 35 y.o.   MRN: [id number]  HPI  Here for f/u of chronic medical problems as well as headaches   Wt is up 8 lb with bmi of 39  BP Readings from Last 3 Encounters:  10/11/15 132/84  03/16/15 137/79  12/11/14 112/64    She keeps having really bad headaches  Went to opth- told her not to wear glasses on computer   Not helpful  Wakes up with a headache and goes to bed with a headache  Most of headaches are on the left side  For a little over a month   Of note- she has never had a migraine before   One day she had to call in sick   Get relief from a dark quiet cold room  Has sens to light and sound  Headaches throb   Takes ibuprofen for headaches  Also taking mobic - did not know that she could not take both   Drinks one bottle of mt dew occ at lunch   Does not get nauseated or vomit   No change in OC - and ha are not menstrual   More work put on her at work- very Art gallery manager someone and this will be over in a month    Sometimes one side of her face will turn red and hot    Still taking adderall- still helps a lot (thankful for that)  Still taking ambien - almost every night- occ not on a weekend  Gets good sleep - bed 9-10 and gets up 5- 5:30  Not too bad   Just started an exercise class at a gym - fun - but very challenging because she is sore all over   Patient Active Problem List   Diagnosis Date Noted  . ADD (attention deficit disorder) 09/03/2014  . Thyroid disorder 09/05/2013  . Tendonitis of wrist, right 09/02/2012  . Insomnia 09/02/2012   No past medical history on file. No past surgical history on file. Social History  Substance Use Topics  . Smoking status: Never Smoker   . Smokeless tobacco: Never Used  . Alcohol Use: 0.0 oz/week    0 Standard drinks or equivalent per week     Comment: occasional    No family history on file. No Known Allergies Current Outpatient  Prescriptions on File Prior to Visit  Medication Sig Dispense Refill  . amphetamine-dextroamphetamine (ADDERALL) 10 MG tablet Take one pill by mouth every am and one at lunchtime 60 tablet 0  . levonorgestrel-ethinyl estradiol (SEASONALE,INTROVALE,JOLESSA) 0.15-0.03 MG tablet Take 1 tablet by mouth daily.    . meloxicam (MOBIC) 15 MG tablet TAKE 1 TABLET (15 MG TOTAL) BY MOUTH DAILY. 30 tablet 8  . zolpidem (AMBIEN) 10 MG tablet Take 1 tablet (10 mg total) by mouth at bedtime as needed. for sleep 30 tablet 5   No current facility-administered medications on file prior to visit.    Review of Systems Review of Systems  Constitutional: Negative for fever, appetite change, fatigue and unexpected weight change.  Eyes: Negative for pain and visual disturbance.  Respiratory: Negative for cough and shortness of breath.   Cardiovascular: Negative for cp or palpitations    Gastrointestinal: Negative for nausea, diarrhea and constipation.  Genitourinary: Negative for urgency and frequency.  Skin: Negative for pallor or rash   Neurological: Negative for weakness, light-headedness, numbness and pos for  headaches.  Hematological: Negative for  adenopathy. Does not bruise/bleed easily.  Psychiatric/Behavioral: Negative for dysphoric mood. The patient is not nervous/anxious.         Objective:   Physical Exam  Constitutional: She is oriented to person, place, and time. She appears well-developed and well-nourished. No distress.  obese and well appearing   HENT:  Head: Normocephalic and atraumatic.  Right Ear: External ear normal.  Left Ear: External ear normal.  Nose: Nose normal.  Mouth/Throat: Oropharynx is clear and moist. No oropharyngeal exudate.  No sinus tenderness No temporal tenderness  No TMJ tenderness  Eyes: Conjunctivae and EOM are normal. Pupils are equal, round, and reactive to light. Right eye exhibits no discharge. Left eye exhibits no discharge. No scleral icterus.  No  nystagmus  Neck: Normal range of motion and full passive range of motion without pain. Neck supple. No JVD present. Carotid bruit is not present. No tracheal deviation present. No thyromegaly present.  Cardiovascular: Normal rate, regular rhythm and normal heart sounds.   No murmur heard. Pulmonary/Chest: Effort normal and breath sounds normal. No respiratory distress. She has no wheezes. She has no rales.  Abdominal: Soft. Bowel sounds are normal. She exhibits no distension and no mass. There is no tenderness.  Musculoskeletal: She exhibits no edema or tenderness.  Lymphadenopathy:    She has no cervical adenopathy.  Neurological: She is alert and oriented to person, place, and time. She has normal strength and normal reflexes. She displays no atrophy and no tremor. No cranial nerve deficit or sensory deficit. She exhibits normal muscle tone. She displays a negative Romberg sign. Coordination and gait normal.  No focal cerebellar signs   Skin: Skin is warm and dry. No rash noted. No pallor.  Psychiatric: She has a normal mood and affect. Her behavior is normal. Thought content normal.  Attentive and pleasant          Assessment & Plan:   Problem List Items Addressed This Visit      Cardiovascular and Mediastinum   Migraine - Primary    W/o aura New in the past mo correlating with inc in stress at work that will resolve in April  Rev lifestyle habits in detail Disc optimal habits to prev HA in incl avoiding caffeine  No ibuprofen or other nsaids while already on meloxicam For rescue -px imitrex (disc poss side eff in detail and handout given)  Also flexeril with warning of sedation  If no imp - will need to f/u to disc prophylaxis >25 minutes spent in face to face time with patient, >50% spent in counselling or coordination of care       Relevant Medications   SUMAtriptan (IMITREX) 100 MG tablet   cyclobenzaprine (FLEXERIL) 10 MG tablet     Other   ADD (attention deficit  disorder)    Stable with current adderall  Refilled today        Insomnia    Remus Loffler works well  Does not correlate with onset of headaches  Refilled  Disc risk of habit and other side eff

## 2015-10-11 NOTE — Assessment & Plan Note (Signed)
W/o aura New in the past mo correlating with inc in stress at work that will resolve in April  Rev lifestyle habits in detail Disc optimal habits to prev HA in incl avoiding caffeine  No ibuprofen or other nsaids while already on meloxicam For rescue -px imitrex (disc poss side eff in detail and handout given)  Also flexeril with warning of sedation  If no imp - will need to f/u to disc prophylaxis >25 minutes spent in face to face time with patient, >50% spent in counselling or coordination of care

## 2015-10-11 NOTE — Patient Instructions (Addendum)
I hope your headaches get better when you get some help at work  Drink lots of water and avoid caffeine unless you have a headache (some caffeine is ok for a bad headache)  Keep exercising Keep bed and wake times the same  Try to eat a healthy diet   Don't take ibuprofen when you are on meloxicam   Try imitrex for a headache - if side effects or not helpful please let me know   Try flexeril (a muscle relaxer) for a headache as needed (when not working or driving) - it may sedate you   Also try an ice pack on your head on the side that hurts when it hurts    If not improved in 1-2 weeks - we need to put you on a daily medicine to prevent headaches

## 2015-10-11 NOTE — Assessment & Plan Note (Signed)
Stable with current adderall  Refilled today

## 2015-10-11 NOTE — Progress Notes (Signed)
Pre visit review using our clinic review tool, if applicable. No additional management support is needed unless otherwise documented below in the visit note. 

## 2015-10-11 NOTE — Assessment & Plan Note (Signed)
Remus Loffler works well  Does not correlate with onset of headaches  Refilled  Disc risk of habit and other side eff

## 2015-12-09 ENCOUNTER — Other Ambulatory Visit: Payer: Self-pay | Admitting: Podiatry

## 2015-12-21 ENCOUNTER — Other Ambulatory Visit: Payer: Self-pay

## 2015-12-21 MED ORDER — AMPHETAMINE-DEXTROAMPHETAMINE 10 MG PO TABS: ORAL_TABLET | ORAL | Status: DC

## 2015-12-21 NOTE — Telephone Encounter (Signed)
Px printed for pick up in IN box  

## 2015-12-21 NOTE — Telephone Encounter (Signed)
Pt left v/m requesting rx for Adderall. Call when ready for pick up. Pt last seen and rx last printed # 60 on 10/11/15.

## 2015-12-22 NOTE — Telephone Encounter (Signed)
Pt notified Rx ready for pickup 

## 2015-12-30 ENCOUNTER — Encounter: Payer: Self-pay | Admitting: Family Medicine

## 2016-02-07 ENCOUNTER — Encounter: Payer: Self-pay | Admitting: Family Medicine

## 2016-02-19 LAB — HM PAP SMEAR: HM Pap smear: NORMAL

## 2016-04-18 ENCOUNTER — Other Ambulatory Visit: Payer: Self-pay | Admitting: *Deleted

## 2016-04-18 MED ORDER — ZOLPIDEM TARTRATE 10 MG PO TABS: 10.0000 mg | ORAL_TABLET | Freq: Every evening | ORAL | 5 refills | Status: DC | PRN

## 2016-04-18 NOTE — Telephone Encounter (Signed)
Last OV 10/11/15, last filled was 10/11/15 #30 with 5 additional refills, please advise

## 2016-04-18 NOTE — Telephone Encounter (Signed)
Rx called in as prescribed 

## 2016-04-18 NOTE — Telephone Encounter (Signed)
Px written for call in   

## 2016-05-05 ENCOUNTER — Other Ambulatory Visit: Payer: Self-pay

## 2016-05-05 MED ORDER — AMPHETAMINE-DEXTROAMPHETAMINE 10 MG PO TABS: ORAL_TABLET | ORAL | 0 refills | Status: DC

## 2016-05-05 NOTE — Telephone Encounter (Signed)
Px printed for pick up in IN box  

## 2016-05-05 NOTE — Telephone Encounter (Signed)
Pt left v/m requesting rx for Adderall. Call when ready for pick up.last printed # 60 on 12/21/15. Last seen 10/11/15.

## 2016-05-05 NOTE — Telephone Encounter (Signed)
Pt notified Rx ready for pickup 

## 2016-05-29 ENCOUNTER — Telehealth: Payer: Self-pay | Admitting: Family Medicine

## 2016-05-29 NOTE — Telephone Encounter (Signed)
Patient advised.  Appt scheduled with Jae DireKate 05/30/16.

## 2016-05-29 NOTE — Telephone Encounter (Signed)
Please get UC notes if possible and ask pt if she knows what abx she was given in shot or orally Go to ED if high temp/ ST so severe she cannot swallow or intolerable symptoms Otherwise make appt for first avail here tomorrow for eval

## 2016-05-29 NOTE — Telephone Encounter (Incomplete)
[facility] - Client TELEPHONE ADVICE RECORD   [facility]     Patient Name: [name] Initial Comment Caller states dx w/Strep on Sunday @ [facility]; was given shot and RX'd meds; worse not better; throat looks worse; ear hurts worse;   DOB: 09/26/1980      Nurse Assessment  Nurse: Tera Materowan, RN, Elnita Maxwellheryl Date/Time (Eastern Time): 05/29/2016 1:37:39 PM  Confirm and document reason for call. If symptomatic, describe symptoms. You must click the next button to save text entered. ---[facility]er [facility] and dx with strep [facility]. She was given an injection and prescribed oral antibiotics. States that today she is still having sore throat and ear pain. Denies fever.  Has the patient traveled out of the country within the last 30 days? ---Not Applicable  Does the patient have any new or worsening symptoms? ---Yes  Will a triage be completed? ---Yes  Related visit to physician within the last 2 weeks? ---Yes  Does the PT have any chronic conditions? (i.e. diabetes, asthma, etc.) ---No  Is the patient pregnant or possibly pregnant? (Ask all females between the ages of 6612-55) ---No  Is this a behavioral health or substance abuse call? ---No    Guidelines     Guideline Title Affirmed Question Affirmed Notes   Strep Throat Infection on Antibiotic Follow-up Call [1] Taking antibiotic < 72 hours (3 days) for strep throat AND [2] sore throat not improved    Final Disposition User   Home Care Tera Materowan, RN, Elnita Maxwellheryl       Disagree/Comply: Comply

## 2016-05-29 NOTE — Telephone Encounter (Signed)
PLEASE NOTE: All timestamps contained within this report are represented as [place] Standard Time. CONFIDENTIALTY NOTICE: This fax transmission is intended only for the addressee. It contains information that is legally privileged, confidential or otherwise protected from use or disclosure. If you are not the intended recipient, you are strictly prohibited from reviewing, disclosing, copying using or disseminating any of this information or taking any action in reliance on or regarding this information. If you have received this fax in error, please notify us immediately by telephone so that we can arrange for its return to us. Phone: [phone], Toll-Free: [phone], Fax: [phone] Page: 1 of 2 Call Id: [id number] [facility] Client TELEPHONE ADVICE RECORD [facility] Patient Name: [name] Gender: Female DOB: December 07, 1980 Age: [age] Y 1 M 9 D Return Phone Number: [phone] (Primary) Address: City/State/Zip: [place] Client [facility] Physician [place], Idamae SchullerMarne - MD Contact Type Call Who Is Calling Patient / Member / Family / Caregiver Call Type Triage / Clinical Relationship To Patient Self Return Phone Number [phone] (Primary) Chief Complaint Earache Reason for Call Symptomatic / Request for Health Information Initial Comment Caller states dx w/Strep on Sunday @ [facility]; was given shot and RX'd meds; worse not better; throat looks worse; ear hurts worse; Appointment Disposition EMR Appointment Not Necessary Info pasted into Epic Yes PreDisposition Call Doctor Translation No Nurse Assessment Nurse: Tera Materowan, RN, Elnita Maxwellheryl Date/Time (Eastern Time): 05/29/2016 1:37:39 PM Confirm and document reason for call. If symptomatic, describe symptoms. You must click the next button to save text entered. ---[facility]er [facility] and  dx with strep throat. She was given an injection and prescribed oral antibiotics. States that today she is still having sore throat and ear pain. Denies fever. Has the patient traveled out of the country within the last 30 days? ---Not Applicable Does the patient have any new or worsening symptoms? ---Yes Will a triage be completed? ---Yes Related visit to physician within the last 2 weeks? ---Yes Does the PT have any chronic conditions? (i.e. diabetes, asthma, etc.) ---No Is the patient pregnant or possibly pregnant? (Ask all females between the ages of 4912-55) ---No Is this a behavioral health or substance abuse call? ---No Guidelines Guideline Title Affirmed Question Affirmed Notes Nurse Date/Time (Eastern Time) Strep Throat Infection on Antibiotic Follow-up Call [1] Taking antibiotic < 72 hours (3 days) for strep throat AND [2] sore throat not improved Tera Materowan, RN, Elnita MaxwellCheryl 05/29/2016 1:40:35 PM PLEASE NOTE: All timestamps contained within this report are represented as [place] Standard Time. CONFIDENTIALTY NOTICE: This fax transmission is intended only for the addressee. It contains information that is legally privileged, confidential or otherwise protected from use or disclosure. If you are not the intended recipient, you are strictly prohibited from reviewing, disclosing, copying using or disseminating any of this information or taking any action in reliance on or regarding this information. If you have received this fax in error, please notify us immediately by telephone so that we can arrange for its return to us. Phone: [phone], Toll-Free: [phone], Fax: [phone] Page: 2 of 2 Call Id: [id number] Disp. Time Lamount Cohen(Eastern Time) Disposition Final User 05/29/2016 1:44:02 PM Home Care Yes Tera Materowan, RN, Elizabeth Sauerheryl Caller Understands: Yes Disagree/Comply: Comply Care Advice Given Per Guideline * Most bacterial infections do not respond to the first dose of an antibiotic.  REASSURANCE: HOME CARE: You should be  able to treat this at home. * You gradually get better over 2-3 days. * Sip warm chicken broth or apple juice. SORE THROAT - For relief of sore throat: * Gargle with warm salt water four times a day. To make salt water, put 1/2 teaspoon of salt in 8 oz (240 ml) of warm water. * Eat a soft diet. SOFT DIET: * Cold drinks, popsicles, and milk shakes are especially good. Avoid citrus fruits. * Drink plenty of liquids. This is important to prevent dehydation. DRINK PLENTY LIQUIDS: * A healthy adult should drink 8 cups (240 ml) or more of liquid each day. IBUPROFEN (E.G., MOTRIN, ADVIL): * Another choice is to take 600 mg (three 200 mg pills) by mouth every 8 hours as needed. * Symptoms last over 3 days on antibiotics * Fever lasts over 2 days on antibiotics CALL BACK IF: * You become worse. CARE ADVICE given per Strep Throat Infection on Antibiotic Follow-Up Call (Adult) guideline.

## 2016-05-30 ENCOUNTER — Ambulatory Visit (INDEPENDENT_AMBULATORY_CARE_PROVIDER_SITE_OTHER): Payer: Commercial Managed Care - HMO | Admitting: Primary Care

## 2016-05-30 ENCOUNTER — Telehealth: Payer: Self-pay | Admitting: *Deleted

## 2016-05-30 ENCOUNTER — Encounter: Payer: Self-pay | Admitting: Primary Care

## 2016-05-30 VITALS — BP 126/84 | HR 81 | Temp 98.5°F | Ht 67.0 in | Wt 241.8 lb

## 2016-05-30 DIAGNOSIS — H6121 Impacted cerumen, right ear: Secondary | ICD-10-CM

## 2016-05-30 DIAGNOSIS — R070 Pain in throat: Secondary | ICD-10-CM | POA: Diagnosis not present

## 2016-05-30 DIAGNOSIS — H9201 Otalgia, right ear: Secondary | ICD-10-CM

## 2016-05-30 MED ORDER — NEOMYCIN-COLIST-HC-THONZONIUM 3.3-3-10-0.5 MG/ML OT SUSP: 3.0000 [drp] | Freq: Three times a day (TID) | OTIC | 0 refills | Status: DC

## 2016-05-30 MED ORDER — METHYLPREDNISOLONE ACETATE 80 MG/ML IJ SUSP
80.0000 mg | Freq: Once | INTRAMUSCULAR | Status: AC
  Administered 2016-05-30: 80 mg via INTRAMUSCULAR

## 2016-05-30 MED ORDER — LIDOCAINE VISCOUS 2 % MT SOLN: OROMUCOSAL | 0 refills | Status: DC

## 2016-05-30 NOTE — Telephone Encounter (Signed)
Spoke with pharmacist at [facility] and corrected the Rx. This should be ready for pick up this afternoon, please notify patient.

## 2016-05-30 NOTE — Telephone Encounter (Signed)
Spoken and notified patient of Kate's comments. Patient verbalized understanding. 

## 2016-05-30 NOTE — Patient Instructions (Signed)
Start Cortisporin ear drops. Instill 3 drops into the right ear three times daily for 5-7 days.  Gargle and swallow 10 ml of the viscous lidocaine every 4 hours as needed for throat pain.  Continue Augmentin antibiotics as prescribed.   Please call me if no improvement in your ear and throat in 3-4 days, or if you start running fevers of 101, cannot swallow, have trouble breathing.   It was a pleasure meeting you!

## 2016-05-30 NOTE — Progress Notes (Signed)
Subjective:    Patient ID: [name], female    DOB: [birth year], 35 y.o.   MRN: [id number]  HPI  Ms. [name] is a 35 year old female who presents today for Urgent Care follow up. She presented to [facility] on Sunday with complaints of sore throat and ear pain. She was diagnosed with Strep Throat and treated with an unknown injection (likely steroids ) and oral antibiotics. She called into the Triage system on Monday (yesterday) reporting sore throat and ear pain without fevers.  Today she presents for follow up. She reports sore throat, nasal congestion, ear fullness, ear pain, and headache. Her ear pain is located to the right ear. She has not run fevers since treatment. Denies shortness of breathing, throat closure, wheezing. She's taken Ibuprofen 800 mg three times daily and Augmentin twice daily without much improvement in inflammation and pain. Overall she's feeling slightly better, but her ear and throat are bothersome.   Review of Systems  Constitutional: Positive for chills. Negative for fever.  HENT: Positive for congestion, ear pain, sore throat and trouble swallowing. Negative for voice change.   Respiratory: Negative for cough, shortness of breath and wheezing.   Cardiovascular: Negative for chest pain.       No past medical history on file.   Social History   Social History  . Marital status: Married    Spouse name: N/A  . Number of children: N/A  . Years of education: N/A   Occupational History  . Not on file.   Social History Main Topics  . Smoking status: Never Smoker  . Smokeless tobacco: Never Used  . Alcohol use 0.0 oz/week     Comment: occasional   . Drug use: No  . Sexual activity: Not on file   Other Topics Concern  . Not on file   Social History Narrative  . No narrative on file    No past surgical history on file.  No family history on file.  No Known Allergies  Current Outpatient Prescriptions on File Prior to Visit    Medication Sig Dispense Refill  . amphetamine-dextroamphetamine (ADDERALL) 10 MG tablet Take one pill by mouth every am and one at lunchtime 60 tablet 0  . cyclobenzaprine (FLEXERIL) 10 MG tablet Take 0.5-1 tablets (5-10 mg total) by mouth 3 (three) times daily as needed (when not working or driving for migraine headache). 30 tablet 1  . levonorgestrel-ethinyl estradiol (SEASONALE,INTROVALE,JOLESSA) 0.15-0.03 MG tablet Take 1 tablet by mouth daily.    . meloxicam (MOBIC) 15 MG tablet TAKE 1 TABLET (15 MG TOTAL) BY MOUTH DAILY. 30 tablet 8  . SUMAtriptan (IMITREX) 100 MG tablet Take 1 tablet (100 mg total) by mouth once. May repeat in 2 hours if headache persists or recurs. 10 tablet 3  . zolpidem (AMBIEN) 10 MG tablet Take 1 tablet (10 mg total) by mouth at bedtime as needed. for sleep 30 tablet 5   No current facility-administered medications on file prior to visit.     BP 126/84   Pulse 81   Temp 98.5 F (36.9 C) (Oral)   Ht 5\' 7"  (1.702 m)   Wt 241 lb 12.8 oz (109.7 kg)   SpO2 96%   BMI 37.87 kg/m    Objective:   Physical Exam  Constitutional: She appears well-nourished. She does not appear ill.  HENT:  Right Ear: Ear canal normal.  Left Ear: Tympanic membrane and ear canal normal.  Nose: Right sinus exhibits no maxillary  sinus tenderness and no frontal sinus tenderness. Left sinus exhibits no maxillary sinus tenderness and no frontal sinus tenderness.  Mouth/Throat: Oropharyngeal exudate, posterior oropharyngeal edema and posterior oropharyngeal erythema present.  Right canal impacted with cerumen. Right canal post irrigation with irritation, mild bleeding, and erythema. TM does not appear acutely infected. Oropharynx appears to be healing.   Eyes: Conjunctivae are normal.  Neck: Neck supple.  Cardiovascular: Normal rate and regular rhythm.   Pulmonary/Chest: Effort normal and breath sounds normal. She has no wheezes. She has no rales.  Lymphadenopathy:    She has no  cervical adenopathy.  Skin: Skin is warm and dry.          Assessment & Plan:  Urgent Care Follow Up: Strep Pharyngitis   Diagnosed with streptococcal pharyngitis on Sunday this week. Prescribed Augmenting and provided with what sounds like a steroid injection. Exam today with mild exudate, erythema, and edema to posterior oropharynx, does appear to be healing. No wheezing.  Right canal impacted with cerumen. Canal post irrigation with erythema and irritation with minor bleeding. TM does not appear infectious.  IM Depo Medrol 80 provided today. Continue Augmentin. Rx for Viscous Lidocaine provided for pain to her throat, Cortisporin drops provided for erythema and prevention of infection to right canal. Discussed future use of Deprox drops for cerumen impaction. Return precautions provided.  Morrie Sheldon, NP

## 2016-05-30 NOTE — Telephone Encounter (Signed)
PT came in to let us know that the Pharmacy contacted her and let her know that they no longer make Cortisporin drops. Please call her.

## 2016-05-30 NOTE — Progress Notes (Signed)
Pre visit review using our clinic review tool, if applicable. No additional management support is needed unless otherwise documented below in the visit note. 

## 2016-06-12 ENCOUNTER — Ambulatory Visit (INDEPENDENT_AMBULATORY_CARE_PROVIDER_SITE_OTHER): Payer: Commercial Managed Care - HMO | Admitting: Family Medicine

## 2016-06-12 ENCOUNTER — Encounter: Payer: Self-pay | Admitting: Family Medicine

## 2016-06-12 VITALS — BP 104/70 | HR 78 | Temp 98.4°F | Wt 244.1 lb

## 2016-06-12 DIAGNOSIS — H6981 Other specified disorders of Eustachian tube, right ear: Secondary | ICD-10-CM | POA: Diagnosis not present

## 2016-06-12 DIAGNOSIS — H6980 Other specified disorders of Eustachian tube, unspecified ear: Secondary | ICD-10-CM | POA: Insufficient documentation

## 2016-06-12 NOTE — Progress Notes (Signed)
Subjective:    Patient ID: [name], female    DOB: [birth year], 35 y.o.   MRN: [id number]  HPI Here with R ear pain   She had her ear irrigated and it bled and hurt a lot  Now it feels full and she cannot hear well out of it  No pain - feels pressure /throbbing when lying on that side   Was getting over strep throat - finished oral abx  Px cortisporin ear drops- and then stopped   No stuffy or runny nose    No fever   Patient Active Problem List   Diagnosis Date Noted  . ETD (eustachian tube dysfunction) 06/12/2016  . Migraine 10/11/2015  . ADD (attention deficit disorder) 09/03/2014  . Thyroid disorder 09/05/2013  . Tendonitis of wrist, right 09/02/2012  . Insomnia 09/02/2012   No past medical history on file. No past surgical history on file. Social History  Substance Use Topics  . Smoking status: Never Smoker  . Smokeless tobacco: Never Used  . Alcohol use 0.0 oz/week     Comment: occasional    No family history on file. No Known Allergies Current Outpatient Prescriptions on File Prior to Visit  Medication Sig Dispense Refill  . amphetamine-dextroamphetamine (ADDERALL) 10 MG tablet Take one pill by mouth every am and one at lunchtime 60 tablet 0  . cyclobenzaprine (FLEXERIL) 10 MG tablet Take 0.5-1 tablets (5-10 mg total) by mouth 3 (three) times daily as needed (when not working or driving for migraine headache). 30 tablet 1  . ibuprofen (ADVIL,MOTRIN) 800 MG tablet TK 1 T PO TID  0  . levonorgestrel-ethinyl estradiol (SEASONALE,INTROVALE,JOLESSA) 0.15-0.03 MG tablet Take 1 tablet by mouth daily.    Marland Kitchen lidocaine (XYLOCAINE) 2 % solution Gargle and swallow 10 ml every 4 hours as needed for throat pain. 200 mL 0  . meloxicam (MOBIC) 15 MG tablet TAKE 1 TABLET (15 MG TOTAL) BY MOUTH DAILY. 30 tablet 8  . neomycin-colistin-hydrocortisone-thonzonium (CORTISPORIN-TC) 3.10-21-08-0.5 MG/ML otic suspension Place 3 drops into the right ear 3 (three) times daily. 10 mL 0    . SUMAtriptan (IMITREX) 100 MG tablet Take 1 tablet (100 mg total) by mouth once. May repeat in 2 hours if headache persists or recurs. 10 tablet 3  . zolpidem (AMBIEN) 10 MG tablet Take 1 tablet (10 mg total) by mouth at bedtime as needed. for sleep 30 tablet 5   No current facility-administered medications on file prior to visit.     Review of Systems    Review of Systems  Constitutional: Negative for fever, appetite change, fatigue and unexpected weight change.  Eyes: Negative for pain and visual disturbance.  ENt pos for R ear pain that is improved, neg for ST , pos for mild pnd  Respiratory: Negative for cough and shortness of breath.   Cardiovascular: Negative for cp or palpitations    Gastrointestinal: Negative for nausea, diarrhea and constipation.  Genitourinary: Negative for urgency and frequency.  Skin: Negative for pallor or rash   Neurological: Negative for weakness, light-headedness, numbness and headaches.  Hematological: Negative for adenopathy. Does not bruise/bleed easily.  Psychiatric/Behavioral: Negative for dysphoric mood. The patient is not nervous/anxious.      Objective:   Physical Exam  Constitutional: She appears well-developed and well-nourished. No distress.  overwt and well app  HENT:  Head: Normocephalic and atraumatic.  Left Ear: External ear normal.  Mouth/Throat: Oropharynx is clear and moist. No oropharyngeal exudate.  Nares are boggy  No sinus  tenderness L TM is dull but intact , canal has scant dried blood in it without swelling or d/c R TM is clear with clear canal     Eyes: Conjunctivae and EOM are normal. Pupils are equal, round, and reactive to light. Right eye exhibits no discharge. Left eye exhibits no discharge.  Neck: Normal range of motion. Neck supple.  Cardiovascular: Normal rate, regular rhythm and normal heart sounds.   Pulmonary/Chest: Effort normal and breath sounds normal. No respiratory distress. She has no wheezes. She has  no rales.  Lymphadenopathy:    She has no cervical adenopathy.  Neurological: She is alert. No cranial nerve deficit.  Skin: Skin is warm and dry. No rash noted.  Psychiatric: She has a normal mood and affect.          Assessment & Plan:   Problem List Items Addressed This Visit      Nervous and Auditory   ETD (eustachian tube dysfunction)    Trial of flonase ns to open ET  Breathe steam /warm compresses prn also  No signs of OM or OE today Some dried blood in ear canal (scant) from previous instrumentation (TM is intact)   Update if not starting to improve in a week or if worsening         Other Visit Diagnoses   None.

## 2016-06-12 NOTE — Patient Instructions (Signed)
Get flonase or nasacort nasal spray for ear pressure - it will help clear sinuses - that are connected to your ear tube  Once daily for 2 weeks  It will take a bit of time for this to improve  Update if not starting to improve in a week or if worsening    If you want to use anything in the ear - mineral oil would be ok

## 2016-06-12 NOTE — Assessment & Plan Note (Signed)
Trial of flonase ns to open ET  Breathe steam /warm compresses prn also  No signs of OM or OE today Some dried blood in ear canal (scant) from previous instrumentation (TM is intact)   Update if not starting to improve in a week or if worsening

## 2016-07-05 ENCOUNTER — Other Ambulatory Visit: Payer: Self-pay

## 2016-07-05 MED ORDER — AMPHETAMINE-DEXTROAMPHETAMINE 10 MG PO TABS: ORAL_TABLET | ORAL | 0 refills | Status: DC

## 2016-07-05 NOTE — Telephone Encounter (Signed)
Pt left v/m requesting rx for Adderall. Call when ready for pick up. Last printed # 60 on 05/05/16. Last seen 06/12/16.

## 2016-07-05 NOTE — Telephone Encounter (Signed)
Px printed for pick up in IN box  

## 2016-07-05 NOTE — Telephone Encounter (Signed)
Left voicemail letting pt know Rx ready for pick up 

## 2016-08-31 ENCOUNTER — Other Ambulatory Visit: Payer: Self-pay

## 2016-08-31 NOTE — Telephone Encounter (Signed)
Pt left v/m requesting rx for Adderall. Call when ready for pick up. rx last printed # 60 on 07/05/16. Last seen for ADD on 10/11/15. No future appt scheduled.

## 2016-09-01 MED ORDER — AMPHETAMINE-DEXTROAMPHETAMINE 10 MG PO TABS: ORAL_TABLET | ORAL | 0 refills | Status: DC

## 2016-09-01 NOTE — Telephone Encounter (Signed)
Pt notified Rx ready for pickup 

## 2016-09-01 NOTE — Telephone Encounter (Signed)
Px printed for pick up in IN box  

## 2016-10-20 ENCOUNTER — Other Ambulatory Visit: Payer: Self-pay | Admitting: *Deleted

## 2016-10-20 MED ORDER — ZOLPIDEM TARTRATE 10 MG PO TABS: 10.0000 mg | ORAL_TABLET | Freq: Every evening | ORAL | 5 refills | Status: DC | PRN

## 2016-10-20 NOTE — Telephone Encounter (Signed)
Px written for call in   

## 2016-10-20 NOTE — Telephone Encounter (Signed)
Pt had an acute appt but no recent f/u or CPE, last filled on 04/18/16 #30 tabs with 5 additional refills, please advise

## 2016-10-20 NOTE — Telephone Encounter (Signed)
Rx called in as prescribed 

## 2016-10-26 ENCOUNTER — Other Ambulatory Visit: Payer: Self-pay

## 2016-10-26 NOTE — Telephone Encounter (Signed)
Pt left v/m requesting rx for Adderall. Call when ready for pick up. Last printed # 60 on 09/01/16. Last seen 10/11/15 for ADD. No future appt scheduled.

## 2016-10-27 MED ORDER — AMPHETAMINE-DEXTROAMPHETAMINE 10 MG PO TABS: ORAL_TABLET | ORAL | 0 refills | Status: DC

## 2016-10-27 NOTE — Telephone Encounter (Signed)
Px printed for pick up in IN box  

## 2016-10-27 NOTE — Telephone Encounter (Signed)
Left voicemail letting pt know Rx ready for pick up 

## 2016-12-12 DIAGNOSIS — M25511 Pain in right shoulder: Secondary | ICD-10-CM | POA: Diagnosis not present

## 2016-12-19 ENCOUNTER — Other Ambulatory Visit: Payer: Self-pay

## 2016-12-19 MED ORDER — AMPHETAMINE-DEXTROAMPHETAMINE 10 MG PO TABS: ORAL_TABLET | ORAL | 0 refills | Status: DC

## 2016-12-19 NOTE — Telephone Encounter (Signed)
Pt notified Rx ready for pick up and she will schedule a f/u with Dr. Milinda Antis with the front desk when she picks up Rx

## 2016-12-19 NOTE — Telephone Encounter (Signed)
Pt left v/m requesting rx for Adderall. Call when ready for pick up. rx last printed # 60 on 10/27/16; last f/u appt 10/11/15; last acute visit 06/12/16; no future appt scheduled.

## 2016-12-19 NOTE — Telephone Encounter (Signed)
Px printed for pick up in IN box Please schedule a summer f/u as well  Thanks

## 2017-01-17 ENCOUNTER — Ambulatory Visit (INDEPENDENT_AMBULATORY_CARE_PROVIDER_SITE_OTHER): Payer: Commercial Managed Care - HMO | Admitting: Family Medicine

## 2017-01-17 ENCOUNTER — Encounter: Payer: Self-pay | Admitting: Family Medicine

## 2017-01-17 VITALS — BP 96/60 | HR 83 | Temp 98.7°F | Ht 67.0 in | Wt 253.5 lb

## 2017-01-17 DIAGNOSIS — Z23 Encounter for immunization: Secondary | ICD-10-CM | POA: Diagnosis not present

## 2017-01-17 DIAGNOSIS — F988 Other specified behavioral and emotional disorders with onset usually occurring in childhood and adolescence: Secondary | ICD-10-CM

## 2017-01-17 DIAGNOSIS — G4700 Insomnia, unspecified: Secondary | ICD-10-CM | POA: Diagnosis not present

## 2017-01-17 DIAGNOSIS — G43701 Chronic migraine without aura, not intractable, with status migrainosus: Secondary | ICD-10-CM

## 2017-01-17 MED ORDER — AMPHETAMINE-DEXTROAMPHETAMINE 10 MG PO TABS: ORAL_TABLET | ORAL | 0 refills | Status: DC

## 2017-01-17 NOTE — Assessment & Plan Note (Signed)
Jaclyn Lofflerambien works well for her-she needs it nightly and takes it correctly  Disc sleep hygeine With better sleep migraines are improved also

## 2017-01-17 NOTE — Progress Notes (Signed)
Subjective:    Patient ID: [name], female    DOB: 01/07/1981, 36 y.o.   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 - able to pay attention  No side effects  Does not use it on the   Hx of migraines  Better than she used to be -less severe and frequent Used to take imitrex - it gave her side effects of stiff neck  occ nsaid -not that often  Sometimes stress headaches turn into migrines    Hx of thyroid problems  Lab Results  Component Value Date   TSH 3.397 09/05/2013     Hx of insomnia for which she takes [place] as needed  Uses it every night and it works well  Aims for bed at 9 am - gets up around 5 am  No side effects or behavioral problems with it   Stress level is not too high   Due for tetanus shot   Sees gyn in July   BP Readings from Last 3 Encounters:  01/17/17 96/60  06/12/16 104/70  05/30/16 126/84    Patient Active Problem List   Diagnosis Date Noted  . Migraine 10/11/2015  . ADD (attention deficit disorder) 09/03/2014  . Insomnia 09/02/2012   No past medical history on file. No past surgical history on file. Social History  Substance Use Topics  . Smoking status: Never Smoker  . Smokeless tobacco: Never Used  . Alcohol use 0.0 oz/week     Comment: occasional    No family history on file. No Known Allergies Current Outpatient Prescriptions on File Prior to Visit  Medication Sig Dispense Refill  . cyclobenzaprine (FLEXERIL) 10 MG tablet Take 0.5-1 tablets (5-10 mg total) by mouth 3 (three) times  daily as needed (when not working or driving for migraine headache). 30 tablet 1  . ibuprofen (ADVIL,MOTRIN) 800 MG tablet TK 1 T PO TID  0  . levonorgestrel-ethinyl estradiol (SEASONALE,INTROVALE,JOLESSA) 0.15-0.03 MG tablet Take 1 tablet by mouth daily.    Marland Kitchen. lidocaine (XYLOCAINE) 2 % solution Gargle and swallow 10 ml every 4 hours as needed for throat pain. 200 mL 0  . meloxicam (MOBIC) 15 MG tablet TAKE 1 TABLET (15 MG TOTAL) BY MOUTH DAILY. 30 tablet 8  . SUMAtriptan (IMITREX) 100 MG tablet Take 1 tablet (100 mg total) by mouth once. May repeat in 2 hours if headache persists or recurs. 10 tablet 3  . zolpidem (AMBIEN) 10 MG tablet Take 1 tablet (10 mg total) by mouth at bedtime as needed. for sleep 30 tablet 5   No current facility-administered medications on file prior to visit.      Review of Systems    Review of Systems  Constitutional: Negative for fever, appetite change, fatigue and unexpected weight change.  Eyes: Negative for pain and visual disturbance.  Respiratory: Negative for cough and shortness of breath.   Cardiovascular: Negative for cp or palpitations    Gastrointestinal: Negative  for nausea, diarrhea and constipation.  Genitourinary: Negative for urgency and frequency.  Skin: Negative for pallor or rash   Neurological: Negative for weakness, light-headedness, numbness and pos for improved  headaches.  Hematological: Negative for adenopathy. Does not bruise/bleed easily.  Psychiatric/Behavioral: Negative for dysphoric mood. The patient is not nervous/anxious.      Objective:   Physical Exam  Constitutional: She appears well-developed and well-nourished. No distress.  obese and well appearing   HENT:  Head: Normocephalic and atraumatic.  Mouth/Throat: Oropharynx is clear and moist.  Eyes: Conjunctivae and EOM are normal. Pupils are equal, round, and reactive to light.  Neck: Normal range of motion. Neck supple. No JVD present. Carotid bruit is not present. No  thyromegaly present.  Cardiovascular: Normal rate, regular rhythm, normal heart sounds and intact distal pulses.  Exam reveals no gallop.   Pulmonary/Chest: Effort normal and breath sounds normal. No respiratory distress. She has no wheezes. She has no rales.  No crackles  Abdominal: Soft. Bowel sounds are normal. She exhibits no distension, no abdominal bruit and no mass. There is no tenderness.  Musculoskeletal: She exhibits no edema.  Lymphadenopathy:    She has no cervical adenopathy.  Neurological: She is alert. She has normal reflexes. No cranial nerve deficit. She exhibits normal muscle tone. Coordination normal.  Skin: Skin is warm and dry. No rash noted. No pallor.  Psychiatric: She has a normal mood and affect.  Attentive Good mood          Assessment & Plan:   Problem List Items Addressed This Visit      Cardiovascular and Mediastinum   Migraine    Doing better lately  Has not needed imitrex  Getting better sleep/hydration and on OC  Will continue to follow         Other   ADD (attention deficit disorder)    Doing well with adderall 10 bid on work days Can take on wkends if needed No side effects  Continue present dose Due for refill today      Insomnia - Primary    Remus Loffler works well for her-she needs it nightly and takes it correctly  Disc sleep hygeine With better sleep migraines are improved also       Other Visit Diagnoses    Need for Tdap vaccination       Relevant Orders   Tdap vaccine greater than or equal to 37yo IM (Completed)

## 2017-01-17 NOTE — Assessment & Plan Note (Signed)
Doing well with adderall 10 bid on work days Can take on wkends if needed No side effects  Continue present dose Due for refill today

## 2017-01-17 NOTE — Assessment & Plan Note (Signed)
Doing better lately  Has not needed imitrex  Getting better sleep/hydration and on OC  Will continue to follow

## 2017-01-17 NOTE — Patient Instructions (Signed)
Take care of yourself  Keep exercising  Keep up good sleep habits   I'm glad Adderall helps-continue it (use on the weekends if needed)   Try to eat a healthy diet with more fruits and vegetables and less processed foods  Tdap vaccine today   See your gyn this summer as planned

## 2017-04-02 ENCOUNTER — Other Ambulatory Visit: Payer: Self-pay | Admitting: *Deleted

## 2017-04-02 MED ORDER — AMPHETAMINE-DEXTROAMPHETAMINE 10 MG PO TABS: ORAL_TABLET | ORAL | 0 refills | Status: DC

## 2017-04-02 NOTE — Telephone Encounter (Signed)
Pt notified Rx ready for pickup 

## 2017-04-02 NOTE — Telephone Encounter (Signed)
Px printed for pick up in IN box  

## 2017-04-02 NOTE — Telephone Encounter (Signed)
Patient left a voicemail requesting a refill on Adderall Last refill 01/17/17 #60 Last office visit same date

## 2017-04-06 ENCOUNTER — Encounter: Payer: Self-pay | Admitting: Family Medicine

## 2017-04-25 DIAGNOSIS — Z01419 Encounter for gynecological examination (general) (routine) without abnormal findings: Secondary | ICD-10-CM | POA: Diagnosis not present

## 2017-05-01 ENCOUNTER — Other Ambulatory Visit: Payer: Self-pay | Admitting: *Deleted

## 2017-05-01 MED ORDER — ZOLPIDEM TARTRATE 10 MG PO TABS: 10.0000 mg | ORAL_TABLET | Freq: Every evening | ORAL | 5 refills | Status: DC | PRN

## 2017-05-01 NOTE — Telephone Encounter (Signed)
Px written for call in   

## 2017-05-01 NOTE — Telephone Encounter (Signed)
Med refill appt was on 01/17/17, last filled on 10/20/16 #30 tabs with 5 additional refills, please advise

## 2017-05-02 NOTE — Telephone Encounter (Signed)
Rx called in as prescribed 

## 2017-05-29 ENCOUNTER — Other Ambulatory Visit: Payer: Self-pay

## 2017-05-29 NOTE — Telephone Encounter (Signed)
Pt left v/m requesting rx for Adderall. Call when ready for pick up. Last printed # 60 on 04/02/17; pt last seen 01/17/17.

## 2017-05-30 MED ORDER — AMPHETAMINE-DEXTROAMPHETAMINE 10 MG PO TABS: ORAL_TABLET | ORAL | 0 refills | Status: DC

## 2017-05-30 NOTE — Telephone Encounter (Signed)
Px printed for pick up in IN box  

## 2017-05-30 NOTE — Telephone Encounter (Signed)
Pt notified Rx ready for pickup 

## 2017-06-14 ENCOUNTER — Ambulatory Visit (INDEPENDENT_AMBULATORY_CARE_PROVIDER_SITE_OTHER): Payer: 59 | Admitting: Podiatry

## 2017-06-14 ENCOUNTER — Ambulatory Visit (INDEPENDENT_AMBULATORY_CARE_PROVIDER_SITE_OTHER): Payer: 59

## 2017-06-14 DIAGNOSIS — M722 Plantar fascial fibromatosis: Secondary | ICD-10-CM | POA: Diagnosis not present

## 2017-06-16 NOTE — Progress Notes (Signed)
She presents today chief complaint of pain in the heel and lateral aspect of the foot in the past several months she elected to consider an x-ray.  Objective: Vital signs are stable she is alert and oriented 3. On palpation medial calcaneal tubercle radiographs do not demonstrate any type of osseus abnormalities of the soft tissue increase in density at the plantar fascia insertion site.  Assessment: Plantar fasciitis right.  Plan: Injected her right heel today with long local anesthetic. Recommended that if this does not resolve consider surgical evaluation and MRI.

## 2017-07-19 ENCOUNTER — Other Ambulatory Visit: Payer: Self-pay | Admitting: Family Medicine

## 2017-07-19 NOTE — Telephone Encounter (Signed)
Copied from CRM [phone]. Topic: Quick Communication - See Telephone Encounter >> Jul 19, 2017  8:54 AM Oneal GroutSebastian, Jennifer S wrote: CRM for notification. See Telephone encounter for: requesting refill on adderall 10mg   07/19/17.

## 2017-07-19 NOTE — Telephone Encounter (Signed)
Last OV 01/18/17. Last filled on 05/30/17 #60 tabs with 0 refills, please advise

## 2017-07-19 NOTE — Telephone Encounter (Signed)
Request for controlled Adderall; last appt 01/17/17

## 2017-07-20 MED ORDER — AMPHETAMINE-DEXTROAMPHETAMINE 10 MG PO TABS: ORAL_TABLET | ORAL | 0 refills | Status: DC

## 2017-07-20 NOTE — Telephone Encounter (Signed)
Px printed for pick up in IN box  

## 2017-07-20 NOTE — Telephone Encounter (Signed)
Left VM letting pt know Rx ready for pick up 

## 2017-09-12 DIAGNOSIS — M25561 Pain in right knee: Secondary | ICD-10-CM | POA: Diagnosis not present

## 2017-09-14 ENCOUNTER — Other Ambulatory Visit: Payer: Self-pay | Admitting: Family Medicine

## 2017-09-14 MED ORDER — AMPHETAMINE-DEXTROAMPHETAMINE 10 MG PO TABS: ORAL_TABLET | ORAL | 0 refills | Status: DC

## 2017-09-14 NOTE — Telephone Encounter (Signed)
Please let her know I filled this electronically (new tech)

## 2017-09-14 NOTE — Telephone Encounter (Signed)
Checked with pharmacy and they did advise me they received Rx. Pt notified Rx sent to her pharmacy

## 2017-09-14 NOTE — Telephone Encounter (Signed)
See prev note, last filled on 07/20/17 #60 tabs with 0 refills. Last OV was 01/17/17, please advise

## 2017-09-14 NOTE — Telephone Encounter (Signed)
Copied from CRM [phone]. Topic: Quick Communication - Rx Refill/Question >> Sep 14, 2017  9:00 AM Gerrianne ScalePayne, [name] wrote: Medication: amphetamine-dextroamphetamine (ADDERALL) 10 MG tablet   Has the patient contacted their pharmacy? Yes.     (Agent: If no, request that the patient contact the pharmacy for the refill.)   Preferred Pharmacy (with phone number or street name): [id number], [place] - [address] [phone] (Phone) [phone] (Fax)     Agent: Please be advised that RX refills may take up to 3 business days. We ask that you follow-up with your pharmacy.

## 2017-10-31 ENCOUNTER — Other Ambulatory Visit: Payer: Self-pay | Admitting: Family Medicine

## 2017-10-31 MED ORDER — AMPHETAMINE-DEXTROAMPHETAMINE 10 MG PO TABS: ORAL_TABLET | ORAL | 0 refills | Status: DC

## 2017-10-31 NOTE — Telephone Encounter (Signed)
Pt notified Rx sent to pharmacy electronically

## 2017-10-31 NOTE — Telephone Encounter (Signed)
See prev note. F/u was on 01/17/17, last filled on 09/14/17 #60 tabs

## 2017-10-31 NOTE — Telephone Encounter (Signed)
Copied from CRM [id number]. Topic: Quick Communication - Rx Refill/Question >> Oct 31, 2017 11:19 AM Maia Pettiesrtiz, Kristie S wrote: Medication: adderall - pt has 3 pills left - takes 2/day - will be out tomorrow Has the patient contacted their pharmacy? No. controlled Preferred Pharmacy (with phone number or street name): [id number]- [place], [place] - [address] [phone] (Phone) [phone] (Fax)

## 2017-10-31 NOTE — Telephone Encounter (Signed)
Will refill electronically Please let her know  

## 2017-11-06 ENCOUNTER — Other Ambulatory Visit: Payer: Self-pay | Admitting: Family Medicine

## 2017-11-06 NOTE — Telephone Encounter (Signed)
Copied from CRM [phone]. Topic: Quick Communication - Rx Refill/Question >> Nov 06, 2017 11:50 AM Cipriano BunkerLambe, Annette S wrote:  Medication: zolpidem (AMBIEN) 10 MG tablet  [facility] to [facility] with no reply  Has the patient contacted their pharmacy? Yes.     (Agent: If no, request that the patient contact the pharmacy for the refill.)   Preferred Pharmacy (with phone number or street name): Preferred Pharmacy     [id number]- [place], [place] - [address] [place] [id number] Phone: [phone] Fax: [phone]     Agent: Please be advised that RX refills may take up to 3 business days. We ask that you follow-up with your pharmacy.

## 2017-11-06 NOTE — Telephone Encounter (Signed)
Please schedule f/u for June and refill until then  

## 2017-11-06 NOTE — Telephone Encounter (Signed)
Ambien refill Last OV: 01/17/17 Last Refill:05/01/17 #30 tabs [facility] [address] PCP: Roxy MannsMarne Tower MD

## 2017-11-06 NOTE — Telephone Encounter (Signed)
[facility] Last refilled # 30 x 5 on 05/01/17 Last seen; 01/17/17 with no future appt scheduled.Please advise.

## 2017-11-07 MED ORDER — ZOLPIDEM TARTRATE 10 MG PO TABS: 10.0000 mg | ORAL_TABLET | Freq: Every evening | ORAL | 2 refills | Status: DC | PRN

## 2017-11-07 NOTE — Telephone Encounter (Signed)
appt scheduled for 01/01/18, since it's a control sub, refill request routed back to PCP

## 2017-12-20 DIAGNOSIS — J3089 Other allergic rhinitis: Secondary | ICD-10-CM | POA: Diagnosis not present

## 2017-12-20 DIAGNOSIS — J0140 Acute pansinusitis, unspecified: Secondary | ICD-10-CM | POA: Diagnosis not present

## 2018-01-01 ENCOUNTER — Encounter: Payer: Self-pay | Admitting: Family Medicine

## 2018-01-01 ENCOUNTER — Ambulatory Visit: Payer: 59 | Admitting: Family Medicine

## 2018-01-01 ENCOUNTER — Encounter: Payer: Self-pay | Admitting: *Deleted

## 2018-01-01 VITALS — BP 110/72 | HR 80 | Temp 98.6°F | Ht 67.0 in | Wt 252.0 lb

## 2018-01-01 DIAGNOSIS — K219 Gastro-esophageal reflux disease without esophagitis: Secondary | ICD-10-CM | POA: Insufficient documentation

## 2018-01-01 DIAGNOSIS — Z8669 Personal history of other diseases of the nervous system and sense organs: Secondary | ICD-10-CM | POA: Diagnosis not present

## 2018-01-01 DIAGNOSIS — F988 Other specified behavioral and emotional disorders with onset usually occurring in childhood and adolescence: Secondary | ICD-10-CM | POA: Diagnosis not present

## 2018-01-01 DIAGNOSIS — G4700 Insomnia, unspecified: Secondary | ICD-10-CM | POA: Diagnosis not present

## 2018-01-01 MED ORDER — OMEPRAZOLE 20 MG PO CPDR: 20.0000 mg | DELAYED_RELEASE_CAPSULE | Freq: Every day | ORAL | 3 refills | Status: DC

## 2018-01-01 MED ORDER — AMPHETAMINE-DEXTROAMPHETAMINE 10 MG PO TABS: ORAL_TABLET | ORAL | 0 refills | Status: DC

## 2018-01-01 NOTE — Progress Notes (Signed)
Subjective:    Patient ID: [name], female    DOB: [birth year], 37 y.o.   MRN: [id number]  HPI Here for f/u of chronic health problems   Working a lot  Has a cruise planned in summer as well as trip to [place]   Has been feeling ok  Just got over a sinus infection - better / was tx with prednisone and sudafed and guaifen  Also nasal spray  A little cough left   Wt Readings from Last 3 Encounters:  01/01/18 252 lb (114.3 kg)  01/17/17 253 lb 8 oz (115 kg)  06/12/16 244 lb 1.9 oz (110.7 kg)  stable from last year  Eating healthy  Exercise - takes the stairs at work and walks at lunch when she can  39.47 kg/m   Hx of ADD  tx with adderall 10 mg bid  Still working very well  No problems   BP Readings from Last 3 Encounters:  01/01/18 110/72  01/17/17 96/60  06/12/16 104/70   Pulse Readings from Last 3 Encounters:  01/01/18 80  01/17/17 83  06/12/16 78    Hx of migraines - improved Now rarely gets them  Very manageable (despite a lot of stress at work)  Has not needed imitrex    Insomnia  Takes ambien every night  Sleeps well with it  Does not take if she does not have 8 hours to sleep  Does have good sleep hygiene  No caffine after work    Takes omeprazole for GERD /heartburn  Buys otc Would save $ with px Cannot go a day w/o it  She does eat spicy food -tries to control this but very difficult for her    Gets lipids and tsh with gyn  Cholesterol has been good   Patient Active Problem List   Diagnosis Date Noted  . GERD (gastroesophageal reflux disease) 01/01/2018  . H/O migraine 10/11/2015  . ADD (attention deficit disorder) 09/03/2014  . Insomnia 09/02/2012   History reviewed. No pertinent past medical history. History reviewed. No pertinent surgical history. Social History   Tobacco Use  . Smoking status: Never Smoker  . Smokeless tobacco: Never Used  Substance Use Topics  . Alcohol use: Yes    Alcohol/week: 0.0 oz    Comment:  occasional   . Drug use: No   History reviewed. No pertinent family history. No Known Allergies Current Outpatient Medications on File Prior to Visit  Medication Sig Dispense Refill  . levonorgestrel-ethinyl estradiol (SEASONALE,INTROVALE,JOLESSA) 0.15-0.03 MG tablet Take 1 tablet by mouth daily.    Marland Kitchen lidocaine (XYLOCAINE) 2 % solution Gargle and swallow 10 ml every 4 hours as needed for throat pain. 200 mL 0  . zolpidem (AMBIEN) 10 MG tablet Take 1 tablet (10 mg total) by mouth at bedtime as needed. for sleep 30 tablet 2   No current facility-administered medications on file prior to visit.     Review of Systems  Constitutional: Negative for activity change, appetite change, fatigue, fever and unexpected weight change.  HENT: Negative for congestion, ear pain, rhinorrhea, sinus pressure and sore throat.   Eyes: Negative for pain, redness and visual disturbance.  Respiratory: Negative for cough, shortness of breath and wheezing.   Cardiovascular: Negative for chest pain and palpitations.  Gastrointestinal: Negative for abdominal pain, blood in stool, constipation and diarrhea.       Heartburn if she does not take ppi  Endocrine: Negative for polydipsia and polyuria.  Genitourinary: Negative for dysuria, frequency  and urgency.  Musculoskeletal: Negative for arthralgias, back pain and myalgias.  Skin: Negative for pallor and rash.  Allergic/Immunologic: Negative for environmental allergies.  Neurological: Positive for headaches. Negative for dizziness and syncope.       Occ headache  Hematological: Negative for adenopathy. Does not bruise/bleed easily.  Psychiatric/Behavioral: Positive for decreased concentration and sleep disturbance. Negative for dysphoric mood. The patient is not nervous/anxious.        Objective:   Physical Exam  Constitutional: She appears well-developed and well-nourished. No distress.  obese and well appearing   HENT:  Head: Normocephalic and atraumatic.    Mouth/Throat: Oropharynx is clear and moist.  Eyes: Pupils are equal, round, and reactive to light. Conjunctivae and EOM are normal.  Neck: Normal range of motion. Neck supple. No JVD present. Carotid bruit is not present. No thyromegaly present.  Cardiovascular: Normal rate, regular rhythm, normal heart sounds and intact distal pulses. Exam reveals no gallop.  Pulmonary/Chest: Effort normal and breath sounds normal. No respiratory distress. She has no wheezes. She has no rales.  No crackles  Abdominal: Soft. Bowel sounds are normal. She exhibits no distension, no abdominal bruit and no mass. There is no tenderness.  Musculoskeletal: She exhibits no edema.  Lymphadenopathy:    She has no cervical adenopathy.  Neurological: She is alert. She has normal reflexes. She displays normal reflexes. No cranial nerve deficit. She exhibits normal muscle tone. Coordination normal.  No tremor   Skin: Skin is warm and dry. No rash noted.  Psychiatric: She has a normal mood and affect.  Pleasant and talkative           Assessment & Plan:   Problem List Items Addressed This Visit      Digestive   GERD (gastroesophageal reflux disease)    Refill omeprazole 20 mg  Disc diet - she would benefit from wt loss as well as less spicy food /GERD diet discussed      Relevant Medications   omeprazole (PRILOSEC) 20 MG capsule     Other   ADD (attention deficit disorder) - Primary    Doing well with adderall 10 mg bid  Will continue this  No side effects  UDS today      Relevant Orders   Pain Mgmt, Profile 8 w/Conf, U   H/O migraine    Doing better- seldom gets a migraine any more Health habits are fair      Insomnia    ambein helps much (does not take it if she has to go to bed very late)  No side effects Disc sleep hygiene and imp of exercise during the day as well as caffeine avoidance

## 2018-01-01 NOTE — Patient Instructions (Addendum)
Stay healthy   Stay fit  Avoid excess spicy foods and things that trigger gerd  Weight loss helps also  Try to get most of your carbohydrates from produce (with the exception of white potatoes)  Eat less bread/pasta/rice/snack foods/cereals/sweets and other items from the middle of the grocery store (processed carbs)   No change in medicines  Urine drug screen today

## 2018-01-02 LAB — PAIN MGMT, PROFILE 8 W/CONF, U
6 ACETYLMORPHINE: NEGATIVE ng/mL (ref <10)
ALCOHOL METABOLITES: NEGATIVE ng/mL (ref <500)
AMPHETAMINES: NEGATIVE ng/mL (ref <500)
Benzodiazepines: NEGATIVE ng/mL (ref <100)
Buprenorphine, Urine: NEGATIVE ng/mL (ref <5)
COCAINE METABOLITE: NEGATIVE ng/mL (ref <150)
Creatinine: 96.2 mg/dL
MARIJUANA METABOLITE: NEGATIVE ng/mL (ref <20)
MDMA: NEGATIVE ng/mL (ref <500)
OXYCODONE: NEGATIVE ng/mL (ref <100)
Opiates: NEGATIVE ng/mL (ref <100)
Oxidant: NEGATIVE ug/mL (ref <200)
pH: 7 (ref 4.5–9.0)

## 2018-01-02 NOTE — Assessment & Plan Note (Signed)
ambein helps much (does not take it if she has to go to bed very late)  No side effects Disc sleep hygiene and imp of exercise during the day as well as caffeine avoidance

## 2018-01-02 NOTE — Assessment & Plan Note (Signed)
Doing better- seldom gets a migraine any more Health habits are fair

## 2018-01-02 NOTE — Assessment & Plan Note (Signed)
Doing well with adderall 10 mg bid  Will continue this  No side effects  UDS today

## 2018-01-02 NOTE — Assessment & Plan Note (Signed)
Refill omeprazole 20 mg  Disc diet - she would benefit from wt loss as well as less spicy food /GERD diet discussed

## 2018-02-05 ENCOUNTER — Other Ambulatory Visit: Payer: Self-pay | Admitting: *Deleted

## 2018-02-05 MED ORDER — ZOLPIDEM TARTRATE 10 MG PO TABS: 10.0000 mg | ORAL_TABLET | Freq: Every evening | ORAL | 3 refills | Status: DC | PRN

## 2018-02-05 NOTE — Telephone Encounter (Signed)
Last office visit 01/01/2018.  Last refilled 11/07/17 for #30 with 2 refills.  Ok to refill?

## 2018-02-14 ENCOUNTER — Telehealth: Payer: Self-pay | Admitting: Family Medicine

## 2018-02-14 MED ORDER — AMPHETAMINE-DEXTROAMPHETAMINE 10 MG PO TABS: ORAL_TABLET | ORAL | 0 refills | Status: DC

## 2018-02-14 NOTE — Telephone Encounter (Signed)
Last OV and Rx 01/01/18 #60.

## 2018-02-14 NOTE — Telephone Encounter (Signed)
Sent. Thanks.   

## 2018-02-14 NOTE — Addendum Note (Signed)
Addended by: Joaquim NamUNCAN, GRAHAM S on: 02/14/2018 11:33 PM   Modules accepted: Orders

## 2018-02-14 NOTE — Telephone Encounter (Signed)
Copied from CRM [id number]. Topic: Quick Communication - Rx Refill/Question >> Feb 14, 2018  8:06 AM Maia Pettiesrtiz, Kristie S wrote: Medication: amphetamine-dextroamphetamine (ADDERALL) 10 MG tablet - pt will take last 2 today - takes 2/day - advised to call 3-5 days ahead of time in the future Has the patient contacted their pharmacy? Yes - advised to call [id number] [place], [place] - [address] [phone] (Phone) [phone] (Fax)

## 2018-04-10 ENCOUNTER — Other Ambulatory Visit: Payer: Self-pay | Admitting: Family Medicine

## 2018-04-10 MED ORDER — AMPHETAMINE-DEXTROAMPHETAMINE 10 MG PO TABS: ORAL_TABLET | ORAL | 0 refills | Status: DC

## 2018-04-10 NOTE — Telephone Encounter (Signed)
Copied from CRM [phone] [phone]. Topic: Quick Communication - Rx Refill/Question >> Apr 10, 2018  8:06 AM Gerrianne ScalePayne, Libbie Bartley L wrote: Medication:amphetamine-dextroamphetamine (ADDERALL) 10 MG tablet  Has the patient contacted their pharmacy? Yes.   (Agent: If no, request that the patient contact the pharmacy for the refill.) (Agent: If yes, when and what did the pharmacy advise?)  call your provider  Preferred Pharmacy (with phone number or street name): [id number] [place], [place] - [address] [phone] (Phone) [phone] (Fax)    Agent: Please be advised that RX refills may take up to 3 business days. We ask that you follow-up with your pharmacy.

## 2018-04-10 NOTE — Telephone Encounter (Signed)
Name of Medication: Adderall 10 mg [facility] [place] Written Date and Quantity: #60 on 02/14/18 Last Office Visit and Type: 01/01/18 ADD Next Office Visit and Type:none

## 2018-04-10 NOTE — Telephone Encounter (Signed)
adderall refill Last Refill:02/14/18 # 60 Last OV: 01/01/18 PCP: Dr Milinda Antisower [facility]acy[facility] [address] [place] [place]

## 2018-05-23 ENCOUNTER — Other Ambulatory Visit: Payer: Self-pay | Admitting: Family Medicine

## 2018-05-23 MED ORDER — AMPHETAMINE-DEXTROAMPHETAMINE 10 MG PO TABS: ORAL_TABLET | ORAL | 0 refills | Status: DC

## 2018-05-23 NOTE — Telephone Encounter (Signed)
Copied from CRM [id number]. Topic: Quick Communication - Rx Refill/Question >> May 23, 2018  9:21 AM Crist Infante wrote: Medication: amphetamine-dextroamphetamine (ADDERALL) 10 MG tablet Last OV 01/01/18 [id number], [place] - [address] [phone] (Phone) [phone] (Fax)

## 2018-05-23 NOTE — Telephone Encounter (Signed)
Name of Medication: Adderall 10 mg Name of [facility] [place] or Written Date and Quantity: #60 on 04/10/18 Last Office Visit and Type: 01/01/18 ADD Next Office Visit and Type: none scheduled Last Controlled Substance Agreement Date: 01/09/18 Last UDS:01/01/18

## 2018-06-05 ENCOUNTER — Other Ambulatory Visit: Payer: Self-pay | Admitting: *Deleted

## 2018-06-05 MED ORDER — ZOLPIDEM TARTRATE 10 MG PO TABS: 10.0000 mg | ORAL_TABLET | Freq: Every evening | ORAL | 3 refills | Status: DC | PRN

## 2018-06-05 NOTE — Telephone Encounter (Signed)
Name of Medication: Ambien [facility] or Written Date and Quantity: 02/05/18 #30 tabs with 3 additional refills Last Office Visit and Type: 01/01/18 ADD Next Office Visit and Type: none scheduled Last Controlled Substance Agreement Date: 01/09/18 Last UDS:01/01/18

## 2018-06-13 DIAGNOSIS — G5621 Lesion of ulnar nerve, right upper limb: Secondary | ICD-10-CM | POA: Diagnosis not present

## 2018-06-19 ENCOUNTER — Other Ambulatory Visit: Payer: Self-pay | Admitting: *Deleted

## 2018-06-19 DIAGNOSIS — G5601 Carpal tunnel syndrome, right upper limb: Secondary | ICD-10-CM

## 2018-06-22 LAB — HM PAP SMEAR: HM Pap smear: NORMAL

## 2018-07-09 DIAGNOSIS — Z01419 Encounter for gynecological examination (general) (routine) without abnormal findings: Secondary | ICD-10-CM | POA: Diagnosis not present

## 2018-07-09 DIAGNOSIS — Z6841 Body Mass Index (BMI) 40.0 and over, adult: Secondary | ICD-10-CM | POA: Diagnosis not present

## 2018-07-22 ENCOUNTER — Other Ambulatory Visit: Payer: Self-pay | Admitting: *Deleted

## 2018-07-22 MED ORDER — AMPHETAMINE-DEXTROAMPHETAMINE 10 MG PO TABS: ORAL_TABLET | ORAL | 0 refills | Status: DC

## 2018-07-22 NOTE — Telephone Encounter (Addendum)
Name of Medication: Adderall [facility] Written Date and Quantity: 05/23/18 #60 Last Office Visit and Type: 01/01/18 Med follow-up Next Office Visit and Type: none scheduled Last Controlled Substance Agreement Date: 04/06/17 Last UDS:01/01/18

## 2018-07-24 DIAGNOSIS — J018 Other acute sinusitis: Secondary | ICD-10-CM | POA: Diagnosis not present

## 2018-07-25 ENCOUNTER — Ambulatory Visit (INDEPENDENT_AMBULATORY_CARE_PROVIDER_SITE_OTHER): Payer: 59 | Admitting: Neurology

## 2018-07-25 DIAGNOSIS — G5621 Lesion of ulnar nerve, right upper limb: Secondary | ICD-10-CM

## 2018-07-25 DIAGNOSIS — G5601 Carpal tunnel syndrome, right upper limb: Secondary | ICD-10-CM | POA: Diagnosis not present

## 2018-07-25 NOTE — Procedures (Signed)
[facility]  [address], [address]  [place], [place] [address] Tel: [phone] Fax:  [phone] Test Date:  07/25/2018  Patient: [name] DOB: 1981/02/21 Physician: Nita Sickle, DO  Sex: Female Height: 5\' 7"  Ref Phys: Salvatore Marvel, MD  ID#: [id number] Temp: 38.0C Technician:    Patient Complaints: This is a 37 year old female referred for evaluation of right elbow pain, and numbness and tingling in the fourth and fifth fingers.  NCV & EMG Findings: Extensive electrodiagnostic testing of the right upper extremity shows:  1. Right median, ulnar, dorsal ulnar cutaneous, and mixed palmar sensory responses are within normal limits. 2. Right median motor responses within normal limits.  Right ulnar motor response shows absolute conduction velocity slowing across the elbow, with normal latency and amplitude. 3. There is no evidence of active or chronic motor axonal loss changes affecting any of the tested muscles.  Motor unit configuration and recruitment pattern is within normal limits.  Impression: Right ulnar neuropathy with slowing across the elbow, purely demyelinating and type, and mild in degree electrically.   ___________________________ Nita Sickle, DO    Nerve Conduction Studies Anti Sensory Summary Table   Site NR Peak (ms) Norm Peak (ms) P-T Amp (V) Norm P-T Amp  Right DorsCutan Anti Sensory (Dorsum 5th MC)  38C  Wrist    1.4 <3.1 26.5 >12  Right Median Anti Sensory (2nd Digit)  38C  Wrist    2.4 <3.4 55.9 >20  Right Ulnar Anti Sensory (5th Digit)  38C  Wrist    2.1 <3.1 53.0 >12   Motor Summary Table   Site NR Onset (ms) Norm Onset (ms) O-P Amp (mV) Norm O-P Amp Site1 Site2 Delta-0 (ms) Dist (cm) Vel (m/s) Norm Vel (m/s)  Right Median Motor (Abd Poll Brev)  38C  Wrist    2.4 <3.9 9.9 >6 Elbow Wrist 4.2 26.0 62 >50  Elbow    6.6  9.9         Right Ulnar Motor (Abd Dig Minimi)  38C  Wrist    1.6 <3.1 11.4 >7 B Elbow Wrist 3.0 22.0 73  >50  B Elbow    4.6  10.5  A Elbow B Elbow 1.7 10.0 59 >50  A Elbow    6.3  10.2         Right Ulnar (FDI) Motor (1st DI)  38C  Wrist    3.0 <4.3 15.2 >7 B Elbow Wrist 3.0 22.0 73 >50  B Elbow    6.0  14.3  A Elbow B Elbow 1.7 10.0 59 >50  A Elbow    7.7  13.2          Comparison Summary Table   Site NR Peak (ms) Norm Peak (ms) P-T Amp (V) Site1 Site2 Delta-P (ms) Norm Delta (ms)  Right Median/Ulnar Palm Comparison (Wrist - 8cm)  38C  Median Palm    1.5 <2.2 53.2 Median Palm Ulnar Palm 0.3   Ulnar Palm    1.2 <2.2 16.4       EMG   Side Muscle Ins Act Fibs Psw Fasc Number Recrt Dur Dur. Amp Amp. Poly Poly. Comment  Right 1stDorInt Nml Nml Nml Nml Nml Nml Nml Nml Nml Nml Nml Nml N/A  Right PronatorTeres Nml Nml Nml Nml Nml Nml Nml Nml Nml Nml Nml Nml N/A  Right Biceps Nml Nml Nml Nml Nml Nml Nml Nml Nml Nml Nml Nml N/A  Right Triceps Nml Nml Nml Nml Nml Nml Nml  Nml Nml Nml Nml Nml N/A  Right Deltoid Nml Nml Nml Nml Nml Nml Nml Nml Nml Nml Nml Nml N/A  Right ABD Dig Min Nml Nml Nml Nml Nml Nml Nml Nml Nml Nml Nml Nml N/A  Right FlexCarpiUln Nml Nml Nml Nml Nml Nml Nml Nml Nml Nml Nml Nml N/A      Waveforms:

## 2018-08-06 DIAGNOSIS — G5621 Lesion of ulnar nerve, right upper limb: Secondary | ICD-10-CM | POA: Diagnosis not present

## 2018-08-22 DIAGNOSIS — G5621 Lesion of ulnar nerve, right upper limb: Secondary | ICD-10-CM | POA: Insufficient documentation

## 2018-09-20 DIAGNOSIS — G5621 Lesion of ulnar nerve, right upper limb: Secondary | ICD-10-CM | POA: Diagnosis not present

## 2018-10-01 ENCOUNTER — Other Ambulatory Visit: Payer: Self-pay

## 2018-10-01 MED ORDER — AMPHETAMINE-DEXTROAMPHETAMINE 10 MG PO TABS: ORAL_TABLET | ORAL | 0 refills | Status: DC

## 2018-10-01 NOTE — Telephone Encounter (Signed)
Name of Medication: Adderall [facility] Date and Quantity: 07/22/18, #60 Last Office Visit and Type: 01/01/18, f/u Next Office Visit and Type: none Last Controlled Substance Agreement Date: 01/01/18 Last UDS: 01/01/18

## 2018-10-17 ENCOUNTER — Other Ambulatory Visit: Payer: Self-pay | Admitting: Family Medicine

## 2018-10-18 NOTE — Telephone Encounter (Signed)
Name of Medication:Ambien [facility] or Written Date and Quantity:06/05/18 #30 tabs with 3 additional refills Last Office Visit and Type:01/01/18 ADD Next Office Visit and Type:none scheduled Last Controlled Substance Agreement Date:01/09/18 Last UDS:01/01/18  **please see pharmacy's comment "not to exceed 2 additional fills before 12/02/18**

## 2018-11-14 ENCOUNTER — Telehealth: Payer: Self-pay

## 2018-11-14 MED ORDER — AMPHETAMINE-DEXTROAMPHETAMINE 10 MG PO TABS: ORAL_TABLET | ORAL | 0 refills | Status: DC

## 2018-11-14 NOTE — Telephone Encounter (Signed)
Received IBC from patient requesting refill of Adderall. States she has one more pill remaining in current prescription.     Last OV: 01/01/2018 Last refill date: 10/01/2018 Quantity: 60 Refills: 0 [id number]

## 2018-12-09 ENCOUNTER — Other Ambulatory Visit: Payer: Self-pay | Admitting: Family Medicine

## 2019-01-22 ENCOUNTER — Ambulatory Visit (INDEPENDENT_AMBULATORY_CARE_PROVIDER_SITE_OTHER): Payer: 59 | Admitting: Family Medicine

## 2019-01-22 ENCOUNTER — Encounter: Payer: Self-pay | Admitting: Family Medicine

## 2019-01-22 ENCOUNTER — Other Ambulatory Visit: Payer: Self-pay | Admitting: Family Medicine

## 2019-01-22 VITALS — Wt 248.0 lb

## 2019-01-22 DIAGNOSIS — F988 Other specified behavioral and emotional disorders with onset usually occurring in childhood and adolescence: Secondary | ICD-10-CM | POA: Diagnosis not present

## 2019-01-22 DIAGNOSIS — G4700 Insomnia, unspecified: Secondary | ICD-10-CM

## 2019-01-22 MED ORDER — AMPHETAMINE-DEXTROAMPHETAMINE 10 MG PO TABS: ORAL_TABLET | ORAL | 0 refills | Status: DC

## 2019-01-22 NOTE — Telephone Encounter (Signed)
Patient is requesting refill   Adderall  Patient is completely out of medication   Patient Phone - [phone]   [facility]

## 2019-01-22 NOTE — Telephone Encounter (Signed)
Name of Medication: Adderall 10 mg [facility] of [facility] [place] or Written Date and Quantity: # 60 on 11/14/18 Last Office Visit and Type: 01/01/2018 Next Office Visit and Type: none scheduled Last Controlled Substance Agreement Date: 01/09/18 Last UDS:01/01/18.  Pt is out of medication and pt scheduled virtual visit today at 11:30 with Dr Milinda Antis to refill Adderall 10 mg. FYI to Dr Milinda Antis.

## 2019-01-22 NOTE — Assessment & Plan Note (Signed)
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

## 2019-01-22 NOTE — Progress Notes (Signed)
Virtual Visit via Video Note  I connected with [name] on 01/22/19 at 11:30 AM EDT by a video enabled telemedicine application and verified that I am speaking with the correct person using two identifiers.  Location: Patient: work [employer] officer, [employer])  Provider: office   I discussed the limitations of evaluation and management by telemedicine and the availability of in person appointments. The patient expressed understanding and agreed to proceed.  History of Present Illness: Feeling very good  Pt presents for annual f/u of ADD and chronic medical problems   Mood has been ok  Not too anxious about the pandemic  Has kept kids at home  Home schooling 2 days per week   Drinking lots of water    Weight: Last wt 248 at home  Gets a lot of exercise at work   [employer] Financial[employer]t Readings from Last 3 Encounters:  01/01/18 252 lb (114.3 kg)  01/17/17 253 lb 8 oz (115 kg)  06/12/16 244 lb 1.9 oz (110.7 kg)    She takes adderall 10 mg bid - still working well  Concentration  Takes on work days  No side effects at all  Last refill was on 3/26 for 60 pills  Needs a refill   Headaches-few and far between  Chronic insomnia  Jaclyn Generalakes ambien for this -still working well for her  No side effects -no sleep walking  Last refill was 5/25 for 30 pills  Rev [employer] database today No red flags  She sees gyn in November (Dr Cherly Hensenousins) She gets a pap every year -had one in November /no abnormalities     Observations/Objective: Patient appears well, in no distress Weight is baseline  No facial swelling or asymmetry Normal voice-not hoarse and no slurred speech No obvious tremor or mobility impairment Moving neck and UEs normally Able to hear the call well  No cough or shortness of breath during interview  Talkative and mentally sharp with no cognitive changes No skin changes on face or neck , no rash or pallor Affect is normal  Attentive and pleasant   Review of Systems  Constitutional: Negative for  chills, diaphoresis, fever, malaise/fatigue and weight loss.  HENT: Negative for hearing loss and sore throat.   Eyes: Negative for blurred vision.  Respiratory: Negative for cough, shortness of breath and wheezing.   Cardiovascular: Negative for chest pain, palpitations and leg swelling.  Gastrointestinal: Negative for abdominal pain and diarrhea.  Musculoskeletal: Negative for myalgias.  Skin: Negative for rash.  Neurological: Negative for dizziness and headaches.  Psychiatric/Behavioral: Negative for depression and memory loss. The patient has insomnia. The patient is not nervous/anxious.     Patient Active Problem List   Diagnosis Date Noted  . GERD (gastroesophageal reflux disease) 01/01/2018  . H/O migraine 10/11/2015  . ADD (attention deficit disorder) 09/03/2014  . Insomnia 09/02/2012   History reviewed. No pertinent past medical history. History reviewed. No pertinent surgical history. Social History   Tobacco Use  . Smoking status: Never Smoker  . Smokeless tobacco: Never Used  Substance Use Topics  . Alcohol use: Yes    Alcohol/week: 0.0 standard drinks    Comment: occasional   . Drug use: No   History reviewed. No pertinent family history. No Known Allergies Current Outpatient Medications on File Prior to Visit  Medication Sig Dispense Refill  . levonorgestrel-ethinyl estradiol (SEASONALE,INTROVALE,JOLESSA) 0.15-0.03 MG tablet Take 1 tablet by mouth daily.    Marland Kitchen. omeprazole (PRILOSEC) 20 MG capsule TAKE 1 CAPSULE BY MOUTH EVERY DAY 30  capsule 11  . zolpidem (AMBIEN) 10 MG tablet TAKE 1 TABLET (10 MG TOTAL) BY MOUTH AT BEDTIME AS NEEDED. FOR SLEEP 30 tablet 3   No current facility-administered medications on file prior to visit.     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 The patient was provided an opportunity to ask questions and all were answered. The patient agreed with the plan and demonstrated an understanding of the instructions.   The patient was advised to call back or seek an in-person evaluation if the symptoms worsen or if the condition fails to improve as anticipated.     Roxy Manns, MD

## 2019-01-22 NOTE — Assessment & Plan Note (Signed)
[name] continues to work well after failing other tx Good habits and sleep hygiene Rev poss side eff/dependence She has not had problems

## 2019-01-22 NOTE — Patient Instructions (Signed)
I refilled your adderall The pharmacy will let us know when you need refill of ambien Continue good health habits/diet and exercise  Drink plenty of water when working outside [employer] you are sleeping well and concentrating well   Let us know if you have any questions or concerns

## 2019-02-11 ENCOUNTER — Other Ambulatory Visit: Payer: Self-pay | Admitting: Family Medicine

## 2019-02-12 NOTE — Telephone Encounter (Signed)
Name of Medication: Ambien Name [facility] or Written Date and Quantity: 10/18/18 #30 tabs with 0 refills Last Office Visit and Type:  Med refill on 01/22/19 Next Office Visit and Type: none scheduled

## 2019-04-09 ENCOUNTER — Other Ambulatory Visit: Payer: Self-pay | Admitting: Family Medicine

## 2019-04-09 MED ORDER — AMPHETAMINE-DEXTROAMPHETAMINE 10 MG PO TABS: ORAL_TABLET | ORAL | 0 refills | Status: DC

## 2019-04-09 NOTE — Telephone Encounter (Signed)
Name of Medication: Adderall 10 mg Name of Pharmacy: [place] or Written Date and Quantity: # 42 on 01/22/19 Last Office Visit and Type:01/22/19 ADD  Next Office Visit and Type: none scheduled Last Controlled Substance Agreement Date: 01/09/2018 Last UDS:01/01/18

## 2019-04-09 NOTE — Telephone Encounter (Signed)
Patient is requesting a refill  ADDERALL  Patient has 1 tablet left  [facility] [place]

## 2019-04-15 ENCOUNTER — Other Ambulatory Visit: Payer: Self-pay | Admitting: Family Medicine

## 2019-04-16 NOTE — Telephone Encounter (Signed)
Name of Medication: Ambien Name of [facility] or Written Date and Quantity: 02/12/19 #30 tabs with 1 refill Last Office Visit and Type:  Med refill on 01/22/19 Next Office Visit and Type: none scheduled

## 2019-06-09 ENCOUNTER — Other Ambulatory Visit: Payer: Self-pay | Admitting: Family Medicine

## 2019-06-09 MED ORDER — AMPHETAMINE-DEXTROAMPHETAMINE 10 MG PO TABS: ORAL_TABLET | ORAL | 0 refills | Status: DC

## 2019-06-09 NOTE — Telephone Encounter (Signed)
Name of Medication: Adderall 10 mg Name [facility] or Written Date and Quantity: # 4 on 04/09/19 Last Office Visit and Type: 01/22/19 med refill ADD Next Office Visit and Type: none scheduled Last Controlled Substance Agreement Date: 01/09/2018 Last UDS:01/01/2018

## 2019-06-09 NOTE — Telephone Encounter (Signed)
Patient is requesting a refill  ADDERALL  Patient has enough for tomorrow    [facility] [place]

## 2019-08-19 ENCOUNTER — Other Ambulatory Visit: Payer: Self-pay | Admitting: Family Medicine

## 2019-08-19 NOTE — Telephone Encounter (Signed)
Name of [place] Name of Pharmacy:[facility] or Written Date and Quantity:04/16/19 #30 tabs with 3 refill Last Office Visit and Type:Med refill on 01/22/19 Next Office Visit and Type:none scheduled

## 2019-08-25 ENCOUNTER — Other Ambulatory Visit: Payer: Self-pay | Admitting: Family Medicine

## 2019-08-25 MED ORDER — AMPHETAMINE-DEXTROAMPHETAMINE 10 MG PO TABS: ORAL_TABLET | ORAL | 0 refills | Status: DC

## 2019-08-25 NOTE — Telephone Encounter (Signed)
Name of Medication:Adderall 10 mg [facility] [place] or Written Date and Quantity: # 60 on 06/09/2019 Last Office Visit and Type: 01/22/19 Add Next Office Visit and Type:none scheduled  Last Controlled Substance Agreement Date: 01/09/2018 Last UDS:01/01/2018

## 2019-08-25 NOTE — Telephone Encounter (Signed)
Patient called to get a refill on Adderral.  Patient said she's out of medication.  [facility]

## 2019-10-13 ENCOUNTER — Other Ambulatory Visit: Payer: Self-pay | Admitting: Family Medicine

## 2019-10-13 MED ORDER — AMPHETAMINE-DEXTROAMPHETAMINE 10 MG PO TABS: ORAL_TABLET | ORAL | 0 refills | Status: DC

## 2019-10-13 NOTE — Telephone Encounter (Signed)
Patient is requesting a refill  Adderall   Patient has 2 tablets left   [facility] [address] [employer]

## 2019-10-13 NOTE — Telephone Encounter (Signed)
Name of Medication:Adderall 10 mg  [facility] Date and Quantity: # 60 on 08/25/19 Last Office Visit and Type: 01/22/2019 med refill Next Office Visit and Type:none scheduled

## 2019-12-03 ENCOUNTER — Other Ambulatory Visit: Payer: Self-pay | Admitting: Family Medicine

## 2019-12-03 MED ORDER — AMPHETAMINE-DEXTROAMPHETAMINE 10 MG PO TABS: ORAL_TABLET | ORAL | 0 refills | Status: DC

## 2019-12-03 NOTE — Telephone Encounter (Signed)
Pt needs refill on Adderall. [facility] She said [facility] told her to contact us then we would contact them to refill prescription.

## 2019-12-03 NOTE — Telephone Encounter (Signed)
Name of Medication:Adderall 10 mg  [facility] or Written Date and Quantity: # 60 on 10/13/19 Last Office Visit and Type: 01/22/2019 med refill Next Office Visit and Type:none scheduled

## 2019-12-17 ENCOUNTER — Other Ambulatory Visit: Payer: Self-pay | Admitting: Family Medicine

## 2019-12-26 ENCOUNTER — Other Ambulatory Visit: Payer: Self-pay | Admitting: Family Medicine

## 2019-12-29 ENCOUNTER — Ambulatory Visit: Payer: 59 | Admitting: Podiatry

## 2019-12-29 ENCOUNTER — Other Ambulatory Visit: Payer: Self-pay

## 2019-12-29 ENCOUNTER — Encounter: Payer: Self-pay | Admitting: Podiatry

## 2019-12-29 DIAGNOSIS — M722 Plantar fascial fibromatosis: Secondary | ICD-10-CM | POA: Diagnosis not present

## 2019-12-29 MED ORDER — METHYLPREDNISOLONE 4 MG PO TBPK
ORAL_TABLET | ORAL | 0 refills | Status: DC
Start: 2019-12-29 — End: 2020-03-03

## 2019-12-29 NOTE — Telephone Encounter (Signed)
Name of Medication:Ambien Name of Pharmacy:[facility] [address] Lavonia Dana or Written Date and Quantity:08/19/19#30 tabs with 3refill Last Office Visit and Type:Doxy Med refill on 01/22/19 Next Office Visit and Type:none scheduled

## 2019-12-29 NOTE — Progress Notes (Signed)
She presents today for chief complaint of painful heels bilaterally.  States I guess I need some new injections.  Objective: Vital signs are stable she is alert oriented x3 pulses are palpable.  No change in her past medical history medications allergies surgery social history she still has pain on palpation medial calcaneal tubercles bilaterally with distalmost aspect of the posterior heel is painful as well.  Assessment: Plantar fasciitis with compensatory insertional Achilles tendinitis.  Plan: Discussed etiology pathology conservative surgical therapies at this point went ahead and injected her bilateral heels today 20 mg Kenalog 5 mg Marcaine start on methylprednisolone.  Should her heels not improve with this I will inject the posterior heels with dexamethasone as well.

## 2020-01-18 ENCOUNTER — Other Ambulatory Visit: Payer: Self-pay | Admitting: Family Medicine

## 2020-01-20 NOTE — Telephone Encounter (Signed)
Pt hasn't been seen in a year and no future appts., please advise  

## 2020-01-20 NOTE — Telephone Encounter (Signed)
Med refilled once and Carrie will reach out to pt to try and schedule appt 

## 2020-01-20 NOTE — Telephone Encounter (Signed)
Please schedule PE or f/u this summer and refill until then  

## 2020-01-27 ENCOUNTER — Other Ambulatory Visit: Payer: Self-pay | Admitting: Family Medicine

## 2020-01-27 MED ORDER — AMPHETAMINE-DEXTROAMPHETAMINE 10 MG PO TABS: ORAL_TABLET | ORAL | 0 refills | Status: DC

## 2020-01-27 NOTE — Telephone Encounter (Signed)
Patient called.  Patient needs a refill on Adderall.  [facility] uses [facility].  Patient has one day left.

## 2020-01-27 NOTE — Telephone Encounter (Signed)
Name of Medication:Adderall 10 mg [facility] or Written Date and Quantity:# 60 on 12/03/19 Last Office Visit and Type:01/22/2019 med refill Next Office Visit and Type:03/04/20 Med refill

## 2020-01-28 ENCOUNTER — Other Ambulatory Visit: Payer: Self-pay | Admitting: Family Medicine

## 2020-03-03 ENCOUNTER — Ambulatory Visit: Payer: 59 | Admitting: Family Medicine

## 2020-03-03 ENCOUNTER — Encounter: Payer: Self-pay | Admitting: Family Medicine

## 2020-03-03 ENCOUNTER — Other Ambulatory Visit: Payer: Self-pay

## 2020-03-03 VITALS — BP 132/80 | HR 80 | Temp 97.5°F | Ht 67.0 in | Wt 265.2 lb

## 2020-03-03 DIAGNOSIS — K219 Gastro-esophageal reflux disease without esophagitis: Secondary | ICD-10-CM

## 2020-03-03 DIAGNOSIS — E6609 Other obesity due to excess calories: Secondary | ICD-10-CM | POA: Insufficient documentation

## 2020-03-03 DIAGNOSIS — Z8639 Personal history of other endocrine, nutritional and metabolic disease: Secondary | ICD-10-CM | POA: Insufficient documentation

## 2020-03-03 DIAGNOSIS — F988 Other specified behavioral and emotional disorders with onset usually occurring in childhood and adolescence: Secondary | ICD-10-CM

## 2020-03-03 DIAGNOSIS — G4700 Insomnia, unspecified: Secondary | ICD-10-CM | POA: Diagnosis not present

## 2020-03-03 MED ORDER — OMEPRAZOLE 20 MG PO CPDR: 20.0000 mg | DELAYED_RELEASE_CAPSULE | Freq: Every day | ORAL | 3 refills | Status: DC

## 2020-03-03 NOTE — Assessment & Plan Note (Signed)
Doing well  No change in stress No mood change- PHQ score of 0 -has been generally happy  Will continue adderall 10 mg bid for work days  Will call for refills Reviewed nccs today and no red flags

## 2020-03-03 NOTE — Assessment & Plan Note (Signed)
Continues ambien after failure of other consv measures and sleep hygiene Does very well with it-no side eff Urged to continue sleep hygeine

## 2020-03-03 NOTE — Progress Notes (Signed)
Subjective:    Patient ID: [name], female    DOB: 1980-10-09, 39 y.o.   MRN: [id number]  This visit occurred during the SARS-CoV-2 public health emergency.  Safety protocols were in place, including screening questions prior to the visit, additional usage of staff PPE, and extensive cleaning of exam room while observing appropriate contact time as indicated for disinfecting solutions.    HPI Pt presents for f/u of chronic health problems   Wt Readings from Last 3 Encounters:  03/03/20 265 lb 3 oz (120.3 kg)  01/22/19 248 lb (112.5 kg)  01/01/18 252 lb (114.3 kg)   41.53 kg/m   Exercise- walks (especially at her son's baseball games around the field)  That works out well   BP Readings from Last 3 Encounters:  03/03/20 132/80  01/01/18 110/72  01/17/17 96/60   Pulse Readings from Last 3 Encounters:  03/03/20 80  01/01/18 80  01/17/17 83   Doing well overall  Work is ok -no changes   ADD-doing well  Takes adderall 10 mg bid  This helps keep her focused  No problems or side effects   GERD Omeprazole 20 mg daily  Does well as long as she takes it   Insomnia  Takes ambien 10 mg daily -takes it every night (no side effects or problems)  Started this after failing behavioral techniques/other tx  Some nights are better than others    covid status  Has been vaccinated   Just went to gyn for annual visit and pap  No mammograms yet  Turns 39 in august   Health screen at [facility]- nl labs incl cholesterol and glucose  Patient Active Problem List   Diagnosis Date Noted  . Morbid obesity (HCC) 03/03/2020  . Cubital tunnel syndrome on right 08/22/2018  . GERD (gastroesophageal reflux disease) 01/01/2018  . H/O migraine 10/11/2015  . ADD (attention deficit disorder) 09/03/2014  . Insomnia 09/02/2012   History reviewed. No pertinent past medical history. History reviewed. No pertinent surgical history. Social History   Tobacco Use  . Smoking status:  Never Smoker  . Smokeless tobacco: Never Used  Substance Use Topics  . Alcohol use: Yes    Alcohol/week: 0.0 standard drinks    Comment: occasional   . Drug use: No   History reviewed. No pertinent family history. No Known Allergies Current Outpatient Medications on File Prior to Visit  Medication Sig Dispense Refill  . amphetamine-dextroamphetamine (ADDERALL) 10 MG tablet Take one pill by mouth every am and one at lunchtime 60 tablet 0  . levonorgestrel-ethinyl estradiol (SEASONALE,INTROVALE,JOLESSA) 0.15-0.03 MG tablet Take 1 tablet by mouth daily.    Marland Kitchen zolpidem (AMBIEN) 10 MG tablet TAKE 1 TABLET (10 MG TOTAL) BY MOUTH AT BEDTIME AS NEEDED. FOR SLEEP 30 tablet 3   No current facility-administered medications on file prior to visit.    Review of Systems  Constitutional: Negative for activity change, appetite change, fatigue, fever and unexpected weight change.  HENT: Negative for congestion, ear pain, rhinorrhea, sinus pressure and sore throat.   Eyes: Negative for pain, redness and visual disturbance.  Respiratory: Negative for cough, shortness of breath and wheezing.   Cardiovascular: Negative for chest pain and palpitations.  Gastrointestinal: Negative for abdominal pain, blood in stool, constipation and diarrhea.  Endocrine: Negative for polydipsia and polyuria.  Genitourinary: Negative for dysuria, frequency and urgency.       Recent nl gyn exam  Musculoskeletal: Negative for arthralgias, back pain and myalgias.  Skin: Negative for  pallor and rash.  Allergic/Immunologic: Negative for environmental allergies.  Neurological: Negative for dizziness, syncope and headaches.  Hematological: Negative for adenopathy. Does not bruise/bleed easily.  Psychiatric/Behavioral: Positive for dysphoric mood and sleep disturbance. Negative for confusion and decreased concentration. The patient is not nervous/anxious.        Adderall works well for her attention issues       Objective:    Physical Exam Constitutional:      General: She is not in acute distress.    Appearance: Normal appearance. She is well-developed. She is obese. She is not ill-appearing.  HENT:     Head: Normocephalic and atraumatic.  Eyes:     Conjunctiva/sclera: Conjunctivae normal.     Pupils: Pupils are equal, round, and reactive to light.  Neck:     Thyroid: No thyromegaly.     Vascular: No carotid bruit or JVD.  Cardiovascular:     Rate and Rhythm: Normal rate and regular rhythm.     Heart sounds: Normal heart sounds. No gallop.   Pulmonary:     Effort: Pulmonary effort is normal. No respiratory distress.     Breath sounds: Normal breath sounds. No wheezing or rales.  Abdominal:     General: Bowel sounds are normal. There is no distension or abdominal bruit.     Palpations: Abdomen is soft. There is no mass.     Tenderness: There is no abdominal tenderness. There is no guarding or rebound.  Musculoskeletal:     Cervical back: Normal range of motion and neck supple.     Right lower leg: No edema.  Lymphadenopathy:     Cervical: No cervical adenopathy.  Skin:    General: Skin is warm and dry.     Findings: No rash.  Neurological:     General: No focal deficit present.     Mental Status: She is alert. Mental status is at baseline.     Motor: No tremor or abnormal muscle tone.     Deep Tendon Reflexes: Reflexes are normal and symmetric. Reflexes normal.  Psychiatric:        Attention and Perception: Attention normal.        Cognition and Memory: Cognition and memory normal.     Comments: Pleasant and attentive today           Assessment & Plan:   Problem List Items Addressed This Visit      Digestive   GERD (gastroesophageal reflux disease)    Does well as long as she takes omeprazole daily  Watches diet (tomato sauce) and does not lie down after eating  Wt gain may worsen this  Refilled medication       Relevant Medications   omeprazole (PRILOSEC) 20 MG capsule      Other   Insomnia    Continues ambien after failure of other consv measures and sleep hygiene Does very well with it-no side eff Urged to continue sleep hygeine      ADD (attention deficit disorder) - Primary    Doing well  No change in stress No mood change- PHQ score of 0 -has been generally happy  Will continue adderall 10 mg bid for work days  Will call for refills Reviewed nccs today and no red flags       Morbid obesity (HCC)    bmi is now over 40  Per pt labs at work for chol/sugar are fine  Now walking regularly   Enc her to make good diet choices  Discussed how this problem influences overall health and the risks it imposes  Reviewed plan for weight loss with lower calorie diet (via better food choices and also portion control or program like weight watchers) and exercise building up to or more than 30 minutes 5 days per week including some aerobic activity

## 2020-03-03 NOTE — Assessment & Plan Note (Signed)
bmi is now over 39  Per pt labs at work for chol/sugar are fine  Now walking regularly   Enc her to make good diet choices Discussed how this problem influences overall health and the risks it imposes  Reviewed plan for weight loss with lower calorie diet (via better food choices and also portion control or program like weight watchers) and exercise building up to or more than 30 minutes 5 days per week including some aerobic activity

## 2020-03-03 NOTE — Assessment & Plan Note (Signed)
Does well as long as she takes omeprazole daily  Watches diet (tomato sauce) and does not lie down after eating  Wt gain may worsen this  Refilled medication

## 2020-03-03 NOTE — Patient Instructions (Addendum)
Exercise -keep it up   Eat a healthy diet  Continue gyn follow up  Keep doing labs at work - if you ever want to bring [place] a copy we can scan into your chart  You can also send [place] dates of your covid shots    No changes in medication   Take care of yourself

## 2020-03-04 ENCOUNTER — Ambulatory Visit: Payer: 59 | Admitting: Family Medicine

## 2020-03-18 ENCOUNTER — Other Ambulatory Visit: Payer: Self-pay | Admitting: Family Medicine

## 2020-03-18 MED ORDER — AMPHETAMINE-DEXTROAMPHETAMINE 10 MG PO TABS: ORAL_TABLET | ORAL | 0 refills | Status: DC

## 2020-03-18 NOTE — Telephone Encounter (Signed)
Patient called.  Patient's requesting a refill on Adderall.  [facility] uses [facility].  Patient has 1 pill left.

## 2020-03-18 NOTE — Telephone Encounter (Signed)
Name of Medication: Adderall 10 mg Name of [facility] [place] or Written Date and Quantity: # 60 on 01/27/2020 Last Office Visit and Type: 03/03/2020 med refill Next Office Visit and Type: none scheduled Last Controlled Substance Agreement Date: 01/09/2018 Last UDS:01/01/2018  Per Carrie's note pt has one pill left.

## 2020-04-28 ENCOUNTER — Other Ambulatory Visit: Payer: Self-pay | Admitting: Family Medicine

## 2020-04-28 NOTE — Telephone Encounter (Signed)
Name of Medication:Ambien Name of Pharmacy:[facility] Lavonia Dana or Written Date and Quantity:12/29/19#30 tabs with3refill Last Office Visit and Type:F/u on 03/04/20 Next Office Visit and Type:none scheduled

## 2020-05-12 ENCOUNTER — Other Ambulatory Visit: Payer: Self-pay | Admitting: Family Medicine

## 2020-05-12 MED ORDER — AMPHETAMINE-DEXTROAMPHETAMINE 10 MG PO TABS: ORAL_TABLET | ORAL | 0 refills | Status: DC

## 2020-05-12 NOTE — Telephone Encounter (Signed)
Name of Medication: Adderall 10 mg Name of [facility]acy: [facility] [place] or Written Date and Quantity: # 60 on 03/18/2020 Last Office Visit and Type: 03/03/2020 med refill Next Office Visit and Type: none scheduled Last Controlled Substance Agreement Date: 01/09/2018 Last UDS:01/01/2018

## 2020-05-12 NOTE — Telephone Encounter (Signed)
Pt called needs to get a refill adderall  Pt is out [facility] meds [facility]

## 2020-05-27 ENCOUNTER — Other Ambulatory Visit: Payer: Self-pay

## 2020-05-27 ENCOUNTER — Ambulatory Visit: Payer: 59 | Admitting: Podiatry

## 2020-05-27 ENCOUNTER — Encounter: Payer: Self-pay | Admitting: Podiatry

## 2020-05-27 DIAGNOSIS — M7661 Achilles tendinitis, right leg: Secondary | ICD-10-CM | POA: Diagnosis not present

## 2020-05-27 DIAGNOSIS — M7662 Achilles tendinitis, left leg: Secondary | ICD-10-CM | POA: Diagnosis not present

## 2020-05-27 DIAGNOSIS — M722 Plantar fascial fibromatosis: Secondary | ICD-10-CM | POA: Diagnosis not present

## 2020-05-27 MED ORDER — METHYLPREDNISOLONE 4 MG PO TBPK
ORAL_TABLET | ORAL | 0 refills | Status: DC
Start: 2020-05-27 — End: 2021-06-07

## 2020-05-27 NOTE — Progress Notes (Signed)
She presents today for follow-up of her plantar fasciitis.  States that her Achilles tendinitis is really starting to flareup as well has recently purchased a new pair of boots and feels that they may not be as stable.  Objective: Vital signs are stable she is alert and oriented x3 she has strong palpable pulses bilaterally.  No edema no erythema cellulitis drainage or odor no open lesions or wounds.  She has pain on palpation medial calcaneal tubercles bilaterally retrocalcaneal bursitis is also noted with fluctuance in the posterior calcaneal area and she also has some tenderness of the Achilles with compression just superior to its insertion.  Assessment: Retrocalcaneal bursitis Achilles tendinitis plan fasciitis bilaterally.  Plan: Injected 2 mg of dexamethasone into the bursa of the posterior aspect of the bilateral heels after sterile Betadine skin prep also injected the plantar fascia today bilaterally 20 mg Kenalog 5 mg Marcaine point maximal tenderness.  Tolerated procedures well without complication start her on methylprednisolone follow-up with me in 1 month or on an as-needed basis discussed possibly getting a different pair shoes.

## 2020-05-31 ENCOUNTER — Other Ambulatory Visit: Payer: Self-pay

## 2020-05-31 ENCOUNTER — Encounter: Payer: Self-pay | Admitting: Family Medicine

## 2020-05-31 ENCOUNTER — Ambulatory Visit: Payer: 59 | Admitting: Family Medicine

## 2020-05-31 VITALS — BP 128/80 | HR 80 | Temp 96.9°F | Ht 67.0 in | Wt 266.2 lb

## 2020-05-31 DIAGNOSIS — Z13220 Encounter for screening for lipoid disorders: Secondary | ICD-10-CM | POA: Insufficient documentation

## 2020-05-31 DIAGNOSIS — R030 Elevated blood-pressure reading, without diagnosis of hypertension: Secondary | ICD-10-CM | POA: Insufficient documentation

## 2020-05-31 DIAGNOSIS — Z23 Encounter for immunization: Secondary | ICD-10-CM | POA: Diagnosis not present

## 2020-05-31 LAB — LIPID PANEL
Cholesterol: 166 mg/dL (ref 0–200)
HDL: 55.2 mg/dL (ref >39.00)
LDL Cholesterol: 87 mg/dL (ref 0–99)
NonHDL: 111.27
Total CHOL/HDL Ratio: 3
Triglycerides: 122 mg/dL (ref 0.0–149.0)
VLDL: 24.4 mg/dL (ref 0.0–40.0)

## 2020-05-31 LAB — CBC WITH DIFFERENTIAL/PLATELET
Basophils Absolute: 0.1 10*3/uL (ref 0.0–0.1)
Basophils Relative: 0.9 % (ref 0.0–3.0)
Eosinophils Absolute: 0.1 10*3/uL (ref 0.0–0.7)
Eosinophils Relative: 1 % (ref 0.0–5.0)
HCT: 38.2 % (ref 36.0–46.0)
Hemoglobin: 12.6 g/dL (ref 12.0–15.0)
Lymphocytes Relative: 32 % (ref 12.0–46.0)
Lymphs Abs: 2.6 10*3/uL (ref 0.7–4.0)
MCHC: 33 g/dL (ref 30.0–36.0)
MCV: 83.6 fl (ref 78.0–100.0)
Monocytes Absolute: 0.4 10*3/uL (ref 0.1–1.0)
Monocytes Relative: 4.8 % (ref 3.0–12.0)
Neutro Abs: 5 10*3/uL (ref 1.4–7.7)
Neutrophils Relative %: 61.3 % (ref 43.0–77.0)
Platelets: 246 10*3/uL (ref 150.0–400.0)
RBC: 4.58 Mil/uL (ref 3.87–5.11)
RDW: 14.9 % (ref 11.5–15.5)
WBC: 8.1 10*3/uL (ref 4.0–10.5)

## 2020-05-31 LAB — COMPREHENSIVE METABOLIC PANEL
ALT: 11 U/L (ref 0–35)
AST: 13 U/L (ref 0–37)
Albumin: 3.8 g/dL (ref 3.5–5.2)
Alkaline Phosphatase: 50 U/L (ref 39–117)
BUN: 10 mg/dL (ref 6–23)
CO2: 27 mEq/L (ref 19–32)
Calcium: 8.7 mg/dL (ref 8.4–10.5)
Chloride: 105 mEq/L (ref 96–112)
Creatinine, Ser: 0.83 mg/dL (ref 0.40–1.20)
GFR: 88.14 mL/min (ref >60.00)
Glucose, Bld: 82 mg/dL (ref 70–99)
Potassium: 4.8 mEq/L (ref 3.5–5.1)
Sodium: 137 mEq/L (ref 135–145)
Total Bilirubin: 0.5 mg/dL (ref 0.2–1.2)
Total Protein: 6.6 g/dL (ref 6.0–8.3)

## 2020-05-31 LAB — TSH: TSH: 5.68 u[IU]/mL — ABNORMAL HIGH (ref 0.35–4.50)

## 2020-05-31 NOTE — Assessment & Plan Note (Addendum)
This has been elevated outside the office  Better here BP Readings from Last 3 Encounters:  05/31/20 128/80  03/03/20 132/80  01/01/18 110/72   Noted she is on placebo week for her OC  bp may be higher on OC- she will check and home/and follow up if needed  She wishes to continue OC -but if not able to end to discuss other contraception options with gyn like iud  Discussed ways to prevent HTN in the future Wt loss/fitness enc  Also DASH eating / more fluid and less sodium (less processed foods)  Labs drawn today for elevated bp

## 2020-05-31 NOTE — Patient Instructions (Addendum)
We may need to see what your blood pressure is on the oral contraceptive   Today it is ok   Eat less sodium/processed foods  Get exercise ! Drink lots of water Less caffeine if possible  Work on weight loss   Take care of yourself   We will send this note to Dr Cherly Hensen   If you get a blood pressure cuff for home  Choose omron for arm (size regular)

## 2020-05-31 NOTE — Progress Notes (Signed)
Subjective:    Patient ID: [name], female    DOB: 1981-02-26, 39 y.o.   MRN: [id number]  This visit occurred during the SARS-CoV-2 public health emergency.  Safety protocols were in place, including screening questions prior to the visit, additional usage of staff PPE, and extensive cleaning of exam room while observing appropriate contact time as indicated for disinfecting solutions.    HPI Pt presents for BP concerns  Wt Readings from Last 3 Encounters:  05/31/20 266 lb 3 oz (120.7 kg)  03/03/20 265 lb 3 oz (120.3 kg)  01/22/19 248 lb (112.5 kg)   41.69 kg/m   Pt states she had a high BP at her gyn office  She is on OC - was not going to refill it  No HTN in the family   She was seen by Dr Al Corpus- had inj in heels - dexamethasone  Was px prednisone  Saw GYN in may/june  Dr Cherly Hensen  She moved to a different location   She took bp at a friend's house -unsure if accurate  Very labile   Oc is seasonale   No headaches  Feels fine  No change in her diet - drinks water all day  2 caffeine drinks at most per day    BP Readings from Last 3 Encounters:  05/31/20 130/80  03/03/20 132/80  01/01/18 110/72    Pulse Readings from Last 3 Encounters:  05/31/20 80  03/03/20 80  01/01/18 80   Not planning on pregnancy  in the future  She thought about tubal   Is interested in labs   Patient Active Problem List   Diagnosis Date Noted  . Elevated BP without diagnosis of hypertension 05/31/2020  . Encounter for screening for lipoid disorders 05/31/2020  . Morbid obesity (HCC) 03/03/2020  . Cubital tunnel syndrome on right 08/22/2018  . GERD (gastroesophageal reflux disease) 01/01/2018  . H/O migraine 10/11/2015  . ADD (attention deficit disorder) 09/03/2014  . Insomnia 09/02/2012   History reviewed. No pertinent past medical history. History reviewed. No pertinent surgical history. Social History   Tobacco Use  . Smoking status: Never Smoker  . Smokeless  tobacco: Never Used  Substance Use Topics  . Alcohol use: Yes    Alcohol/week: 0.0 standard drinks    Comment: occasional   . Drug use: No   History reviewed. No pertinent family history. No Known Allergies Current Outpatient Medications on File Prior to Visit  Medication Sig Dispense Refill  . amphetamine-dextroamphetamine (ADDERALL) 10 MG tablet Take one pill by mouth every am and one at lunchtime 60 tablet 0  . levonorgestrel-ethinyl estradiol (SEASONALE,INTROVALE,JOLESSA) 0.15-0.03 MG tablet Take 1 tablet by mouth daily.    Marland Kitchen omeprazole (PRILOSEC) 20 MG capsule Take 1 capsule (20 mg total) by mouth daily. 90 capsule 3  . zolpidem (AMBIEN) 10 MG tablet TAKE 1 TABLET (10 MG TOTAL) BY MOUTH AT BEDTIME AS NEEDED. FOR SLEEP 30 tablet 3  . methylPREDNISolone (MEDROL DOSEPAK) 4 MG TBPK tablet 6 day dose pack - take as directed (Patient not taking: Reported on 05/31/2020) 21 tablet 0   No current facility-administered medications on file prior to visit.    Review of Systems  Constitutional: Negative for activity change, appetite change, fatigue, fever and unexpected weight change.  HENT: Negative for congestion, ear pain, rhinorrhea, sinus pressure and sore throat.   Eyes: Negative for pain, redness and visual disturbance.  Respiratory: Negative for cough, shortness of breath and wheezing.   Cardiovascular: Negative for  chest pain and palpitations.  Gastrointestinal: Negative for abdominal pain, blood in stool, constipation and diarrhea.  Endocrine: Negative for polydipsia and polyuria.  Genitourinary: Negative for dysuria, frequency and urgency.  Musculoskeletal: Negative for arthralgias, back pain and myalgias.  Skin: Negative for pallor and rash.  Allergic/Immunologic: Negative for environmental allergies.  Neurological: Negative for dizziness, syncope and headaches.  Hematological: Negative for adenopathy. Does not bruise/bleed easily.  Psychiatric/Behavioral: Negative for decreased  concentration and dysphoric mood. The patient is not nervous/anxious.        Objective:   Physical Exam Constitutional:      General: She is not in acute distress.    Appearance: Normal appearance. She is well-developed. She is obese. She is not ill-appearing or diaphoretic.  HENT:     Head: Normocephalic and atraumatic.  Eyes:     Conjunctiva/sclera: Conjunctivae normal.     Pupils: Pupils are equal, round, and reactive to light.  Neck:     Thyroid: No thyromegaly.     Vascular: No carotid bruit or JVD.  Cardiovascular:     Rate and Rhythm: Normal rate and regular rhythm.     Heart sounds: Normal heart sounds. No gallop.   Pulmonary:     Effort: Pulmonary effort is normal. No respiratory distress.     Breath sounds: Normal breath sounds. No wheezing or rales.  Abdominal:     General: Bowel sounds are normal. There is no distension or abdominal bruit.     Palpations: Abdomen is soft. There is no mass.     Tenderness: There is no abdominal tenderness.  Musculoskeletal:     Cervical back: Normal range of motion and neck supple.     Right lower leg: No edema.     Left lower leg: No edema.  Lymphadenopathy:     Cervical: No cervical adenopathy.  Skin:    General: Skin is warm and dry.     Coloration: Skin is not pale.     Findings: No erythema or rash.  Neurological:     Mental Status: She is alert.     Sensory: No sensory deficit.     Coordination: Coordination normal.     Deep Tendon Reflexes: Reflexes are normal and symmetric. Reflexes normal.  Psychiatric:        Mood and Affect: Mood normal.           Assessment & Plan:   Problem List Items Addressed This Visit      Other   Morbid obesity (HCC)    Discussed how this problem influences overall health and the risks it imposes  Reviewed plan for weight loss with lower calorie diet (via better food choices and also portion control or program like weight watchers) and exercise building up to or more than 30  minutes 5 days per week including some aerobic activity   Enc wt loss to help with HTN risk as well       Elevated BP without diagnosis of hypertension - Primary    This has been elevated outside the office  Better here BP Readings from Last 3 Encounters:  05/31/20 128/80  03/03/20 132/80  01/01/18 110/72   Noted she is on placebo week for her OC  bp may be higher on OC- she will check and home/and follow up if needed  She wishes to continue OC -but if not able to end to discuss other contraception options with gyn like iud  Discussed ways to prevent HTN in the future Wt loss/fitness  enc  Also DASH eating / more fluid and less sodium (less processed foods)  Labs drawn today for elevated bp      Relevant Orders   CBC with Differential/Platelet (Completed)   Comprehensive metabolic panel (Completed)   Lipid panel (Completed)   TSH (Completed)   Encounter for screening for lipoid disorders    Lipid panel done today  Discussed low trans /sat fat diet       Relevant Orders   Lipid panel (Completed)    Other Visit Diagnoses    Need for influenza vaccination       Relevant Orders   Flu Vaccine QUAD 6+ mos PF IM (Fluarix Quad PF) (Completed)

## 2020-05-31 NOTE — Assessment & Plan Note (Signed)
Lipid panel done today  Discussed low trans /sat fat diet

## 2020-05-31 NOTE — Assessment & Plan Note (Signed)
Discussed how this problem influences overall health and the risks it imposes  Reviewed plan for weight loss with lower calorie diet (via better food choices and also portion control or program like weight watchers) and exercise building up to or more than 30 minutes 5 days per week including some aerobic activity   Enc wt loss to help with HTN risk as well

## 2020-06-01 ENCOUNTER — Other Ambulatory Visit: Payer: Self-pay | Admitting: Family Medicine

## 2020-06-01 ENCOUNTER — Other Ambulatory Visit (INDEPENDENT_AMBULATORY_CARE_PROVIDER_SITE_OTHER): Payer: 59

## 2020-06-01 DIAGNOSIS — R7989 Other specified abnormal findings of blood chemistry: Secondary | ICD-10-CM | POA: Diagnosis not present

## 2020-06-01 LAB — T4, FREE: Free T4: 0.83 ng/dL (ref 0.60–1.60)

## 2020-06-01 MED ORDER — LEVONORGEST-ETH ESTRAD 91-DAY 0.15-0.03 MG PO TABS: 1.0000 | ORAL_TABLET | Freq: Every day | ORAL | 0 refills | Status: DC

## 2020-06-01 NOTE — Telephone Encounter (Signed)
Pt called and says OB-GYN called her and wants to know if you can prescribe the birth control so you can monitor her b/p.  Please call pt to advise.  Thank you!

## 2020-06-01 NOTE — Telephone Encounter (Signed)
That is fine  I pended px to send to pharmacy of choice   Please f/u with me about 2 weeks after starting it so we can see how BP and other vitals are   Thanks

## 2020-06-01 NOTE — Telephone Encounter (Signed)
Route pt to the front office to make 2 weeks follow-up appointment. Rx sent to [facility]

## 2020-06-03 ENCOUNTER — Encounter: Payer: Self-pay | Admitting: *Deleted

## 2020-06-15 ENCOUNTER — Encounter: Payer: Self-pay | Admitting: Family Medicine

## 2020-06-15 ENCOUNTER — Ambulatory Visit: Payer: 59 | Admitting: Family Medicine

## 2020-06-15 ENCOUNTER — Other Ambulatory Visit: Payer: Self-pay

## 2020-06-15 VITALS — BP 138/78 | HR 90 | Temp 97.4°F | Ht 67.0 in | Wt 261.6 lb

## 2020-06-15 DIAGNOSIS — R7989 Other specified abnormal findings of blood chemistry: Secondary | ICD-10-CM | POA: Insufficient documentation

## 2020-06-15 DIAGNOSIS — R030 Elevated blood-pressure reading, without diagnosis of hypertension: Secondary | ICD-10-CM

## 2020-06-15 NOTE — Assessment & Plan Note (Signed)
Mild elevation with nl FT4 Will continue to monitor No clinical changes

## 2020-06-15 NOTE — Patient Instructions (Signed)
Take care of yourself  Keep working on weight loss Stay active , keep up a good fluid intake and eat less processed and salty foods   No change in medicines If your blood pressure goes up again (and stays up) please let us know

## 2020-06-15 NOTE — Progress Notes (Signed)
Subjective:    Patient ID: [name], female    DOB: 1980-09-02, 39 y.o.   MRN: [id number]  This visit occurred during the SARS-CoV-2 public health emergency.  Safety protocols were in place, including screening questions prior to the visit, additional usage of staff PPE, and extensive cleaning of exam room while observing appropriate contact time as indicated for disinfecting solutions.    HPI Pt presents for 2 wk f/u for blood pressure   Wt Readings from Last 3 Encounters:  06/15/20 261 lb 9 oz (118.6 kg)  05/31/20 266 lb 3 oz (120.7 kg)  03/03/20 265 lb 3 oz (120.3 kg)  working on weight loss/ eating less processed foods  Trying not to eat emotinally 40.97 kg/m  Prev visit- questioned if her OC was elevating her BP  She was enc to discuss other contraception opt with her gyn  Disc lifestyle change to prev HTN   BP Readings from Last 3 Encounters:  06/15/20 138/78  05/31/20 128/80  03/03/20 132/80   She takes seasonale OC currently   Gyn is Dr Cherly Hensen- does not have an appt for a year   Has not been checking at home  Feels ok overall  No headaches or chest pain   Labs done on 10/11  Lab Results  Component Value Date   CREATININE 0.83 05/31/2020   BUN 10 05/31/2020   NA 137 05/31/2020   K 4.8 05/31/2020   CL 105 05/31/2020   CO2 27 05/31/2020   Lab Results  Component Value Date   ALT 11 05/31/2020   AST 13 05/31/2020   ALKPHOS 50 05/31/2020   BILITOT 0.5 05/31/2020   Lab Results  Component Value Date   WBC 8.1 05/31/2020   HGB 12.6 05/31/2020   HCT 38.2 05/31/2020   MCV 83.6 05/31/2020   PLT 246.0 05/31/2020   Lab Results  Component Value Date   TSH 5.68 (H) 05/31/2020   free T4 was in the nl range  Cholesterol  Lab Results  Component Value Date   CHOL 166 05/31/2020   HDL 55.20 05/31/2020   LDLCALC 87 05/31/2020   TRIG 122.0 05/31/2020   CHOLHDL 3 05/31/2020   Patient Active Problem List   Diagnosis Date Noted  . Elevated BP  without diagnosis of hypertension 05/31/2020  . Encounter for screening for lipoid disorders 05/31/2020  . Morbid obesity (HCC) 03/03/2020  . Cubital tunnel syndrome on right 08/22/2018  . GERD (gastroesophageal reflux disease) 01/01/2018  . H/O migraine 10/11/2015  . ADD (attention deficit disorder) 09/03/2014  . Insomnia 09/02/2012   History reviewed. No pertinent past medical history. History reviewed. No pertinent surgical history. Social History   Tobacco Use  . Smoking status: Never Smoker  . Smokeless tobacco: Never Used  Substance Use Topics  . Alcohol use: Yes    Alcohol/week: 0.0 standard drinks    Comment: occasional   . Drug use: No   History reviewed. No pertinent family history. No Known Allergies Current Outpatient Medications on File Prior to Visit  Medication Sig Dispense Refill  . amphetamine-dextroamphetamine (ADDERALL) 10 MG tablet Take one pill by mouth every am and one at lunchtime 60 tablet 0  . levonorgestrel-ethinyl estradiol (SEASONALE) 0.15-0.03 MG tablet Take 1 tablet by mouth daily. 91 tablet 0  . methylPREDNISolone (MEDROL DOSEPAK) 4 MG TBPK tablet 6 day dose pack - take as directed 21 tablet 0  . omeprazole (PRILOSEC) 20 MG capsule Take 1 capsule (20 mg total) by mouth daily. 90  capsule 3  . zolpidem (AMBIEN) 10 MG tablet TAKE 1 TABLET (10 MG TOTAL) BY MOUTH AT BEDTIME AS NEEDED. FOR SLEEP 30 tablet 3   No current facility-administered medications on file prior to visit.    Review of Systems  Constitutional: Negative for activity change, appetite change, fatigue, fever and unexpected weight change.  HENT: Negative for congestion, ear pain, rhinorrhea, sinus pressure and sore throat.   Eyes: Negative for pain, redness and visual disturbance.  Respiratory: Negative for cough, shortness of breath and wheezing.   Cardiovascular: Negative for chest pain and palpitations.  Gastrointestinal: Negative for abdominal pain, blood in stool, constipation and  diarrhea.  Endocrine: Negative for polydipsia and polyuria.  Genitourinary: Negative for dysuria, frequency and urgency.  Musculoskeletal: Negative for arthralgias, back pain and myalgias.  Skin: Negative for pallor and rash.  Allergic/Immunologic: Negative for environmental allergies.  Neurological: Negative for dizziness, syncope and headaches.  Hematological: Negative for adenopathy. Does not bruise/bleed easily.  Psychiatric/Behavioral: Negative for decreased concentration and dysphoric mood. The patient is not nervous/anxious.        A lot of stress over the weekend  Handling it well       Objective:   Physical Exam Constitutional:      General: She is not in acute distress.    Appearance: Normal appearance. She is well-developed. She is obese. She is not ill-appearing or diaphoretic.  HENT:     Head: Normocephalic and atraumatic.  Eyes:     Conjunctiva/sclera: Conjunctivae normal.     Pupils: Pupils are equal, round, and reactive to light.  Neck:     Thyroid: No thyromegaly.     Vascular: No carotid bruit or JVD.  Cardiovascular:     Rate and Rhythm: Normal rate and regular rhythm.     Heart sounds: Normal heart sounds. No gallop.   Pulmonary:     Effort: Pulmonary effort is normal. No respiratory distress.     Breath sounds: Normal breath sounds. No wheezing or rales.  Abdominal:     General: Bowel sounds are normal. There is no distension or abdominal bruit.     Palpations: Abdomen is soft. There is no mass.     Tenderness: There is no abdominal tenderness.  Musculoskeletal:     Cervical back: Normal range of motion and neck supple.  Lymphadenopathy:     Cervical: No cervical adenopathy.  Skin:    General: Skin is warm and dry.     Findings: No rash.  Neurological:     Mental Status: She is alert.     Coordination: Coordination normal.     Deep Tendon Reflexes: Reflexes are normal and symmetric.     Comments: No tremor  Psychiatric:        Mood and Affect:  Mood normal.           Assessment & Plan:   Problem List Items Addressed This Visit      Other   Morbid obesity (HCC)    Wt is down 5 lb Pt is eating less processed foods and making effort to not eat for boredom as well  Enc her to keep up the good work      Elevated BP without diagnosis of hypertension - Primary    Today on current OC- bp is acceptable BP: 138/78  Pt has also worked on better diet and lost some wt Enc her to keep this up  Continue to monitor  We are now px her OC and she  will see gyn in a year        Elevated TSH    Mild elevation with nl FT4 Will continue to monitor No clinical changes

## 2020-06-15 NOTE — Assessment & Plan Note (Signed)
Today on current OC- bp is acceptable BP: 138/78  Pt has also worked on better diet and lost some wt Enc her to keep this up  Continue to monitor  We are now px her OC and she will see gyn in a year

## 2020-06-15 NOTE — Assessment & Plan Note (Signed)
Wt is down 5 lb Pt is eating less processed foods and making effort to not eat for boredom as well  Enc her to keep up the good work

## 2020-07-05 ENCOUNTER — Other Ambulatory Visit: Payer: Self-pay | Admitting: Family Medicine

## 2020-07-05 MED ORDER — AMPHETAMINE-DEXTROAMPHETAMINE 10 MG PO TABS
ORAL_TABLET | ORAL | 0 refills | Status: DC
Start: 2020-07-05 — End: 2020-08-30

## 2020-07-05 NOTE — Telephone Encounter (Signed)
Pt called in wanted to get a refill of the Adderall 10mg 

## 2020-07-05 NOTE — Telephone Encounter (Signed)
Name of Medication:Adderall 10 mg Name of Pharmacy:[facility] Last Fill or Written Date and Quantity:# 60 on 05/12/2020 Last Office Visit and Type:f/u on 06/15/20 Next Office Visit and Type:none scheduled Last Controlled Substance Agreement Date:01/09/2018 Last UDS:01/01/2018

## 2020-07-29 ENCOUNTER — Other Ambulatory Visit: Payer: Self-pay | Admitting: Family Medicine

## 2020-08-03 ENCOUNTER — Telehealth: Payer: Self-pay | Admitting: *Deleted

## 2020-08-03 NOTE — Telephone Encounter (Signed)
Patient left a voicemail stating that she just got a refill on her birth control pills and she was only given a 30 day supply. Patient stated that she usually gets a 90 day supply. Patient stated by her getting a 90 day supply she only has 4 periods a year. Patient wants to know if this can be corrected?

## 2020-08-03 NOTE — Telephone Encounter (Signed)
Pt advised Rx was filled for 90 day supply #91 was sent in, I advised pt to check with pharmacy to see if they only gave a 30 day supply due to insurance, pt verbalized understanding

## 2020-08-29 ENCOUNTER — Other Ambulatory Visit: Payer: Self-pay | Admitting: Family Medicine

## 2020-08-30 ENCOUNTER — Telehealth: Payer: Self-pay | Admitting: Family Medicine

## 2020-08-30 MED ORDER — AMPHETAMINE-DEXTROAMPHETAMINE 10 MG PO TABS
ORAL_TABLET | ORAL | 0 refills | Status: DC
Start: 2020-08-30 — End: 2020-10-22

## 2020-08-30 NOTE — Telephone Encounter (Signed)
Dr. Tower - please advise on refill. Thank you. 

## 2020-08-30 NOTE — Telephone Encounter (Signed)
Medication Refill - Medication:  amphetamine-dextroamphetamine (ADDERALL) 10 MG tablet    Has the patient contacted their pharmacy? No due to it being controlled.   Preferred Pharmacy (with phone number or street name):  [id number] [place], [place] [address] Phone:  [phone]  Fax:  [phone]

## 2020-10-22 ENCOUNTER — Other Ambulatory Visit: Payer: Self-pay | Admitting: Family Medicine

## 2020-10-22 MED ORDER — AMPHETAMINE-DEXTROAMPHETAMINE 10 MG PO TABS: ORAL_TABLET | ORAL | 0 refills | Status: DC

## 2020-10-22 NOTE — Addendum Note (Signed)
Addended by: Shon Millet on: 10/22/2020 09:02 AM   Modules accepted: Orders

## 2020-10-22 NOTE — Addendum Note (Signed)
Addended by: Roxy Manns A on: 10/22/2020 09:57 AM   Modules accepted: Orders

## 2020-10-22 NOTE — Telephone Encounter (Signed)
Patient is needing a refill on the following medication: Adderall Pharmacy: [facility] [address] [place], [place] Patient is out of the medication and is need of it asap.  Thank you. EM

## 2020-10-22 NOTE — Telephone Encounter (Signed)
Name of Medication: Adderall  Name of Pharmacy: [facility] Last Fill or Written Date and Quantity: 08/30/20 #60/ 0 refills Last Office Visit and Type: 06/15/20 f/u Next Office Visit and Type: none scheduled

## 2020-12-10 ENCOUNTER — Other Ambulatory Visit: Payer: Self-pay

## 2020-12-10 MED ORDER — AMPHETAMINE-DEXTROAMPHETAMINE 10 MG PO TABS
ORAL_TABLET | ORAL | 0 refills | Status: DC
Start: 2020-12-10 — End: 2021-02-02

## 2020-12-10 NOTE — Addendum Note (Signed)
Addended by: Roxy Manns A on: 12/10/2020 11:47 AM   Modules accepted: Orders

## 2020-12-10 NOTE — Telephone Encounter (Signed)
Name of Medication: Adderall  Name of Pharmacy: [facility] Last Fill or Written Date and Quantity: 10/22/20 #60/ 0 refills Last Office Visit and Type: 06/15/20 f/u Next Office Visit and Type: none scheduled

## 2020-12-10 NOTE — Addendum Note (Signed)
Addended by: Roena Malady on: 12/10/2020 08:16 AM   Modules accepted: Orders

## 2020-12-27 ENCOUNTER — Other Ambulatory Visit: Payer: Self-pay | Admitting: Family Medicine

## 2020-12-28 NOTE — Telephone Encounter (Signed)
Pharmacy requests refill on: Zolpidem 10 mg   LAST REFILL: 08/30/2020 (Q-30, R-3) LAST OV: 06/15/2020 NEXT OV: Not Scheduled  PHARMACY: [id number] [place], [place]

## 2021-01-15 ENCOUNTER — Other Ambulatory Visit: Payer: Self-pay | Admitting: Family Medicine

## 2021-01-18 NOTE — Telephone Encounter (Signed)
LOV was on 06/15/20 for follow up on B/P. Do not see a CPE with Dr Milinda Antis in the last 2 years.  Last refilled on 08/02/20 #91 with 1 refill. Dr Milinda Antis does patient need CPE or follow up set up for this summer?

## 2021-02-02 ENCOUNTER — Other Ambulatory Visit: Payer: Self-pay | Admitting: Family Medicine

## 2021-02-02 MED ORDER — AMPHETAMINE-DEXTROAMPHETAMINE 10 MG PO TABS
ORAL_TABLET | ORAL | 0 refills | Status: DC
Start: 2021-02-02 — End: 2021-03-29

## 2021-02-02 NOTE — Telephone Encounter (Signed)
Name of Medication: Adderall  Name of Pharmacy: [facility] Last Fill or Written Date and Quantity: 12/10/20 #60/ 0 refills Last Office Visit and Type: 06/15/20 f/u Next Office Visit and Type: none scheduled

## 2021-02-02 NOTE — Telephone Encounter (Signed)
  LAST APPOINTMENT DATE: 01/15/2021   NEXT APPOINTMENT DATE:@Visit  date not found  MEDICATION: adderall   [facility]  Let patient know to contact pharmacy at the end of the day to make sure medication is ready.  Please notify patient to allow 48-72 hours to process  Encourage patient to contact the pharmacy for refills or they can request refills through [facility]  CLINICAL FILLS OUT ALL BELOW:   LAST REFILL:  QTY:  REFILL DATE:    OTHER COMMENTS:    Okay for refill?  Please advise

## 2021-02-16 ENCOUNTER — Other Ambulatory Visit: Payer: Self-pay | Admitting: Family Medicine

## 2021-02-17 ENCOUNTER — Telehealth (INDEPENDENT_AMBULATORY_CARE_PROVIDER_SITE_OTHER): Payer: 59 | Admitting: Family Medicine

## 2021-02-17 DIAGNOSIS — J029 Acute pharyngitis, unspecified: Secondary | ICD-10-CM | POA: Diagnosis not present

## 2021-02-17 DIAGNOSIS — R0981 Nasal congestion: Secondary | ICD-10-CM

## 2021-02-17 NOTE — Progress Notes (Signed)
Virtual Visit via Video Note  I connected with [name]  on 02/17/21 at 12:20 PM EDT by a video enabled telemedicine application and verified that I am speaking with the correct person using two identifiers.  Location patient: home, [place] Location provider:work or home office Persons participating in the virtual visit: patient, provider  I discussed the limitations of evaluation and management by telemedicine and the availability of in person appointments. The patient expressed understanding and agreed to proceed.   HPI:  Acute telemedicine visit for Sore Throat: -Onset: 2 days ago; took a covid test yesterday which was negative -Symptoms include: nasal congestion, sore throat -Denies:fevers, NVD, CP, CP, inability to get out of bed/eat/or drink -Has tried: gargling salt water -Pertinent past medical history: see below, some seasonal allergies -Pertinent medication allergies: No Known Allergies -COVID-19 vaccine status: vaccinated x2 + 1 booster  ROS: See pertinent positives and negatives per HPI.  No past medical history on file.  No past surgical history on file.   Current Outpatient Medications:    ALTAVERA 0.15-30 MG-MCG tablet, TAKE 1 TABLET BY MOUTH EVERY DAY, Disp: 28 tablet, Rfl: 5   amphetamine-dextroamphetamine (ADDERALL) 10 MG tablet, Take one pill by mouth every am and one at lunchtime, Disp: 60 tablet, Rfl: 0   methylPREDNISolone (MEDROL DOSEPAK) 4 MG TBPK tablet, 6 day dose pack - take as directed, Disp: 21 tablet, Rfl: 0   omeprazole (PRILOSEC) 20 MG capsule, TAKE 1 CAPSULE BY MOUTH EVERY DAY, Disp: 90 capsule, Rfl: 0   zolpidem (AMBIEN) 10 MG tablet, TAKE 1 TABLET (10 MG TOTAL) BY MOUTH AT BEDTIME AS NEEDED FOR SLEEP, Disp: 30 tablet, Rfl: 3  EXAM:  VITALS per patient if applicable:  GENERAL: alert, oriented, appears well and in no acute distress  HEENT: atraumatic, conjunttiva clear, no obvious abnormalities on inspection of external nose and ears  NECK:  normal movements of the head and neck  LUNGS: on inspection no signs of respiratory distress, breathing rate appears normal, no obvious gross SOB, gasping or wheezing  CV: no obvious cyanosis  MS: moves all visible extremities without noticeable abnormality  PSYCH/NEURO: pleasant and cooperative, no obvious depression or anxiety, speech and thought processing grossly intact  ASSESSMENT AND PLAN:  Discussed the following assessment and plan:  Sore throat  Nasal congestion  -we discussed possible serious and likely etiologies, options for evaluation and workup, limitations of telemedicine visit vs in person visit, treatment, treatment risks and precautions. Pt prefers to treat via telemedicine empirically rather than in person at this moment.  Query viral upper respiratory illness, possible allergic rhinitis, possible COVID-19 with early false negative testing, versus other.  She opted to repeat a home COVID test today or tomorrow, continue with salt water gargles, add in an antihistamine, analgesic if needed and nasal saline. Work/School slipped offered: declined Advised to seek prompt in person care if worsening, new symptoms arise, or if is not improving with treatment. Discussed options for inperson care if PCP office not available. Did let this patient know that I only do telemedicine on Tuesdays and Thursdays for [place]. Advised to schedule follow up visit with PCP or [facility] if any further questions or concerns to avoid delays in care.   I discussed the assessment and treatment plan with the patient. The patient was provided an opportunity to ask questions and all were answered. The patient agreed with the plan and demonstrated an understanding of the instructions.     Terressa Koyanagi, DO

## 2021-02-17 NOTE — Patient Instructions (Signed)
  HOME CARE TIPS:  -[place] COVID19 testing information: [email or web address] [place] [phone] Most pharmacies also offer testing and home test kits. If the Covid19 test is positive, please make a prompt follow up visit with your primary care office or with [facility] to discuss treatment options. Treatments for Covid19 are best given early in the course of the illness.   -start zyrtec or allegra once daily for 1-2 weeks  -can use tylenol or aleve if needed for fevers, aches and pains per instructions  -can use nasal saline a few times per day if you have nasal congestion  -can use warm salt water gargle and spit for the sore throat  -stay hydrated, drink plenty of fluids and eat small healthy meals - avoid dairy  -If the Covid test is positive, check out the [facility] website for more information on home care, transmission and treatment for COVID19  -follow up with your doctor in 2-3 days unless improving and feeling better  -stay home while sick, except to seek medical care. If you have COVID19, ideally it would be best to stay home for a full 10 days since the onset of symptoms PLUS one day of no fever and feeling better. Wear a good mask that fits snugly (such as N95 or KN95) if around others to reduce the risk of transmission.  It was nice to meet you today, and I really hope you are feeling better soon. I help [place] out with telemedicine visits on Tuesdays and Thursdays and am available for visits on those days. If you have any concerns or questions following this visit please schedule a follow up visit with your Primary Care doctor or seek care at a local urgent care clinic to avoid delays in care.    Seek in person care or schedule a follow up video visit promptly if your symptoms worsen, new concerns arise or you are not improving with treatment. Call 911 and/or seek emergency care if your symptoms are severe or life threatening.

## 2021-03-29 ENCOUNTER — Other Ambulatory Visit: Payer: Self-pay | Admitting: Family Medicine

## 2021-03-29 MED ORDER — AMPHETAMINE-DEXTROAMPHETAMINE 10 MG PO TABS: ORAL_TABLET | ORAL | 0 refills | Status: DC

## 2021-03-29 NOTE — Telephone Encounter (Signed)
  Encourage patient to contact the pharmacy for refills or they can request refills through [facility]  LAST APPOINTMENT DATE: 06/15/20 NEXT APPOINTMENT DATE:  MEDICATION: Adderall  Is the patient out of medication? 2 pills left  [facility]   Let patient know to contact pharmacy at the end of the day to make sure medication is ready.  Please notify patient to allow 48-72 hours to process  CLINICAL FILLS OUT ALL BELOW:   LAST REFILL:  QTY:  REFILL DATE:    OTHER COMMENTS:    Okay for refill?  Please advise

## 2021-03-29 NOTE — Telephone Encounter (Signed)
Name of Medication: Adderall  Name of [facility] Last Fill or Written Date and Quantity: 02/02/21 #60/ 0 refills Last Office Visit and Type: 06/15/20 f/u Next Office Visit and Type: none scheduled

## 2021-04-27 ENCOUNTER — Other Ambulatory Visit: Payer: Self-pay | Admitting: Family Medicine

## 2021-05-02 NOTE — Telephone Encounter (Signed)
Pt called to check status on these prescription refills. She is out of her medication and would like it filled as soon as possible.

## 2021-05-03 NOTE — Telephone Encounter (Signed)
  Encourage patient to contact the pharmacy for refills or they can request refills through [facility]  LAST APPOINTMENT DATE:  Please schedule appointment if longer than 1 year  NEXT APPOINTMENT DATE:  MEDICATION:zolpidem (AMBIEN) 10 MG table omeprazole (PRILOSEC) 20 MG capsul  Is the patient out of medication?   [id number] [place],  [address]  Let patient know to contact pharmacy at the end of the day to make sure medication is ready.  Please notify patient to allow 48-72 hours to process  CLINICAL FILLS OUT ALL BELOW:   LAST REFILL:  QTY:  REFILL DATE:    OTHER COMMENTS:    Okay for refill?  Please advise

## 2021-05-03 NOTE — Telephone Encounter (Signed)
Last refilled on 12/28/20 #90 with 3 refills Lov 06/15/20  Next appt 06/07/2021

## 2021-05-31 ENCOUNTER — Telehealth: Payer: Self-pay | Admitting: Family Medicine

## 2021-05-31 MED ORDER — AMPHETAMINE-DEXTROAMPHETAMINE 10 MG PO TABS: ORAL_TABLET | ORAL | 0 refills | Status: DC

## 2021-05-31 NOTE — Telephone Encounter (Signed)
Pt already scheduled for cpe/labs on 10.18.22

## 2021-05-31 NOTE — Telephone Encounter (Signed)
Refilled Due for a yearly f/u  Please schedule that either here or at [facility] when we return Thanks

## 2021-05-31 NOTE — Telephone Encounter (Signed)
Name of Medication: Adderall  Name of [facility] Last Fill or Written Date and Quantity: 03/29/21 #60/ 0 refills Last Office Visit and Type: 06/15/20 f/u Next Office Visit and Type:06/07/21 CPE

## 2021-05-31 NOTE — Telephone Encounter (Signed)
  Encourage patient to contact the pharmacy for refills or they can request refills through [facility]  LAST APPOINTMENT DATE:  Please schedule appointment if longer than 1 year  NEXT APPOINTMENT DATE:06/07/21  MEDICATION: adderoll  Is the patient out of medication? yes  [facility] [address]  Let patient know to contact pharmacy at the end of the day to make sure medication is ready.  Please notify patient to allow 48-72 hours to process  CLINICAL FILLS OUT ALL BELOW:   LAST REFILL:  QTY:  REFILL DATE:    OTHER COMMENTS:    Okay for refill?  Please advise

## 2021-06-07 ENCOUNTER — Ambulatory Visit (INDEPENDENT_AMBULATORY_CARE_PROVIDER_SITE_OTHER): Payer: 59 | Admitting: Family Medicine

## 2021-06-07 ENCOUNTER — Encounter: Payer: Self-pay | Admitting: Family Medicine

## 2021-06-07 ENCOUNTER — Other Ambulatory Visit: Payer: Self-pay

## 2021-06-07 VITALS — BP 138/80 | HR 83 | Temp 98.2°F | Ht 66.25 in | Wt 263.2 lb

## 2021-06-07 DIAGNOSIS — Z0000 Encounter for general adult medical examination without abnormal findings: Secondary | ICD-10-CM

## 2021-06-07 DIAGNOSIS — G4700 Insomnia, unspecified: Secondary | ICD-10-CM

## 2021-06-07 DIAGNOSIS — Z1231 Encounter for screening mammogram for malignant neoplasm of breast: Secondary | ICD-10-CM | POA: Insufficient documentation

## 2021-06-07 DIAGNOSIS — F988 Other specified behavioral and emotional disorders with onset usually occurring in childhood and adolescence: Secondary | ICD-10-CM

## 2021-06-07 DIAGNOSIS — R7989 Other specified abnormal findings of blood chemistry: Secondary | ICD-10-CM

## 2021-06-07 DIAGNOSIS — E538 Deficiency of other specified B group vitamins: Secondary | ICD-10-CM | POA: Insufficient documentation

## 2021-06-07 DIAGNOSIS — Z79899 Other long term (current) drug therapy: Secondary | ICD-10-CM

## 2021-06-07 LAB — COMPREHENSIVE METABOLIC PANEL
ALT: 11 U/L (ref 0–35)
AST: 13 U/L (ref 0–37)
Albumin: 4 g/dL (ref 3.5–5.2)
Alkaline Phosphatase: 54 U/L (ref 39–117)
BUN: 9 mg/dL (ref 6–23)
CO2: 26 mEq/L (ref 19–32)
Calcium: 8.9 mg/dL (ref 8.4–10.5)
Chloride: 104 mEq/L (ref 96–112)
Creatinine, Ser: 0.81 mg/dL (ref 0.40–1.20)
GFR: 90.35 mL/min (ref >60.00)
Glucose, Bld: 84 mg/dL (ref 70–99)
Potassium: 4.4 mEq/L (ref 3.5–5.1)
Sodium: 137 mEq/L (ref 135–145)
Total Bilirubin: 0.5 mg/dL (ref 0.2–1.2)
Total Protein: 6.7 g/dL (ref 6.0–8.3)

## 2021-06-07 LAB — CBC WITH DIFFERENTIAL/PLATELET
Basophils Absolute: 0 10*3/uL (ref 0.0–0.1)
Basophils Relative: 0.6 % (ref 0.0–3.0)
Eosinophils Absolute: 0.1 10*3/uL (ref 0.0–0.7)
Eosinophils Relative: 1.5 % (ref 0.0–5.0)
HCT: 38.6 % (ref 36.0–46.0)
Hemoglobin: 12.6 g/dL (ref 12.0–15.0)
Lymphocytes Relative: 31.4 % (ref 12.0–46.0)
Lymphs Abs: 2 10*3/uL (ref 0.7–4.0)
MCHC: 32.6 g/dL (ref 30.0–36.0)
MCV: 80.8 fl (ref 78.0–100.0)
Monocytes Absolute: 0.3 10*3/uL (ref 0.1–1.0)
Monocytes Relative: 5.4 % (ref 3.0–12.0)
Neutro Abs: 3.9 10*3/uL (ref 1.4–7.7)
Neutrophils Relative %: 61.1 % (ref 43.0–77.0)
Platelets: 230 10*3/uL (ref 150.0–400.0)
RBC: 4.77 Mil/uL (ref 3.87–5.11)
RDW: 15.2 % (ref 11.5–15.5)
WBC: 6.4 10*3/uL (ref 4.0–10.5)

## 2021-06-07 LAB — LIPID PANEL
Cholesterol: 181 mg/dL (ref 0–200)
HDL: 49 mg/dL (ref >39.00)
LDL Cholesterol: 112 mg/dL — ABNORMAL HIGH (ref 0–99)
NonHDL: 131.78
Total CHOL/HDL Ratio: 4
Triglycerides: 99 mg/dL (ref 0.0–149.0)
VLDL: 19.8 mg/dL (ref 0.0–40.0)

## 2021-06-07 LAB — TSH: TSH: 4.36 u[IU]/mL (ref 0.35–5.50)

## 2021-06-07 LAB — VITAMIN D 25 HYDROXY (VIT D DEFICIENCY, FRACTURES): VITD: 24.42 ng/mL — ABNORMAL LOW (ref 30.00–100.00)

## 2021-06-07 LAB — VITAMIN B12: Vitamin B-12: 153 pg/mL — ABNORMAL LOW (ref 211–911)

## 2021-06-07 LAB — T4, FREE: Free T4: 0.68 ng/dL (ref 0.60–1.60)

## 2021-06-07 MED ORDER — OMEPRAZOLE 20 MG PO CPDR: 20.0000 mg | DELAYED_RELEASE_CAPSULE | Freq: Every day | ORAL | 3 refills | Status: DC

## 2021-06-07 MED ORDER — LEVONORGESTREL-ETHINYL ESTRAD 0.15-30 MG-MCG PO TABS: 1.0000 | ORAL_TABLET | Freq: Every day | ORAL | 3 refills | Status: DC

## 2021-06-07 NOTE — Assessment & Plan Note (Signed)
Vit B12 and D added Pt has been unable to come off of it  Will continue to monitor  Disc risks: renal and OP

## 2021-06-07 NOTE — Patient Instructions (Addendum)
Call and schedule your first mammogram   Labs today   Take care of yourself  Wear sun protection  Exercise  Try to get most of your carbohydrates from produce (with the exception of white potatoes)  Eat less bread/pasta/rice/snack foods/cereals/sweets and other items from the middle of the grocery store (processed carbs)

## 2021-06-07 NOTE — Assessment & Plan Note (Signed)
No clinical changes -mild in past  TSH and FT4 today  Will continue to monitor

## 2021-06-07 NOTE — Assessment & Plan Note (Signed)
Discussed how this problem influences overall health and the risks it imposes  Reviewed plan for weight loss with lower calorie diet (via better food choices and also portion control or program like weight watchers) and exercise building up to or more than 30 minutes 5 days per week including some aerobic activity    

## 2021-06-07 NOTE — Assessment & Plan Note (Signed)
Scheduled annual screening mammogram Nl breast exam today  Encouraged monthly self exams   

## 2021-06-07 NOTE — Progress Notes (Signed)
Subjective:    Patient ID: [name], female    DOB: 04-05-1981, 40 y.o.   MRN: [id number]  This visit occurred during the SARS-CoV-2 public health emergency.  Safety protocols were in place, including screening questions prior to the visit, additional usage of staff PPE, and extensive cleaning of exam room while observing appropriate contact time as indicated for disinfecting solutions.   HPI Here for health maintenance exam and to review chronic medical problems   Wt Readings from Last 3 Encounters:  06/07/21 263 lb 4 oz (119.4 kg)  06/15/20 261 lb 9 oz (118.6 kg)  05/31/20 266 lb 3 oz (120.7 kg)   42.17 kg/m  Doing ok  Not a lot of time for self care  Driving a lot for baseball season -will slow down in November  Took today off   Exercise - walking around the track at games when she is there  Made a habit out of it   Covid status-immunized times 2 with booster    Pap 11/19 nl at gyn [facility] every year  Menses-regular and not heavy or painful  Takes OC   Tdap 5/18 Flu shot 10/22  Mammogram -has not had one yet /ready to schedule Self breast exam -no lumps or changes    BP Readings from Last 3 Encounters:  06/07/21 138/80  06/15/20 138/78  05/31/20 128/80   At home her bp has been in good control  130s systolic  Pulse Readings from Last 3 Encounters:  06/07/21 83  06/15/20 90  05/31/20 80    GERD Takes omeprazole 20 mg daily Keeps her in good control   Insomnia  Takes ambien- every night  No sleep walking   Lab Results  Component Value Date   TSH 5.68 (H) 05/31/2020   Watching this  Due for labs  Not sluggish No change in skin or hair   ADD Takes adderall 10 mg bid   PHQ:  0  Mood is good   Patient Active Problem List   Diagnosis Date Noted   Routine general medical examination at a health care facility 06/07/2021   Current use of proton pump inhibitor 06/07/2021   Encounter for screening mammogram for breast cancer 06/07/2021    Elevated TSH 06/15/2020   Elevated BP without diagnosis of hypertension 05/31/2020   Encounter for screening for lipoid disorders 05/31/2020   Morbid obesity (HCC) 03/03/2020   Cubital tunnel syndrome on right 08/22/2018   GERD (gastroesophageal reflux disease) 01/01/2018   H/O migraine 10/11/2015   ADD (attention deficit disorder) 09/03/2014   Insomnia 09/02/2012   History reviewed. No pertinent past medical history. History reviewed. No pertinent surgical history. Social History   Tobacco Use   Smoking status: Never   Smokeless tobacco: Never  Substance Use Topics   Alcohol use: Yes    Alcohol/week: 0.0 standard drinks    Comment: occasional    Drug use: No   History reviewed. No pertinent family history. No Known Allergies Current Outpatient Medications on File Prior to Visit  Medication Sig Dispense Refill   amphetamine-dextroamphetamine (ADDERALL) 10 MG tablet Take one pill by mouth every am and one at lunchtime 60 tablet 0   zolpidem (AMBIEN) 10 MG tablet TAKE 1 TABLET BY MOUTH AT BEDTIME AS NEEDED FOR SLEEP. 30 tablet 3   No current facility-administered medications on file prior to visit.      Review of Systems  Constitutional:  Negative for activity change, appetite change, fatigue, fever and unexpected weight change.  HENT:  Negative for congestion, ear pain, rhinorrhea, sinus pressure and sore throat.   Eyes:  Negative for pain, redness and visual disturbance.  Respiratory:  Negative for cough, shortness of breath and wheezing.   Cardiovascular:  Negative for chest pain and palpitations.  Gastrointestinal:  Negative for abdominal pain, blood in stool, constipation and diarrhea.  Endocrine: Negative for polydipsia and polyuria.  Genitourinary:  Negative for dysuria, frequency and urgency.  Musculoskeletal:  Negative for arthralgias, back pain and myalgias.  Skin:  Negative for pallor and rash.  Allergic/Immunologic: Negative for environmental allergies.   Neurological:  Negative for dizziness, syncope and headaches.  Hematological:  Negative for adenopathy. Does not bruise/bleed easily.  Psychiatric/Behavioral:  Negative for decreased concentration and dysphoric mood. The patient is not nervous/anxious.       Objective:   Physical Exam Constitutional:      General: She is not in acute distress.    Appearance: Normal appearance. She is well-developed. She is obese. She is not ill-appearing or diaphoretic.  HENT:     Head: Normocephalic and atraumatic.     Right Ear: Tympanic membrane, ear canal and external ear normal.     Left Ear: Tympanic membrane, ear canal and external ear normal.     Nose: Nose normal. No congestion.     Mouth/Throat:     Mouth: Mucous membranes are moist.     Pharynx: Oropharynx is clear. No posterior oropharyngeal erythema.  Eyes:     General: No scleral icterus.    Extraocular Movements: Extraocular movements intact.     Conjunctiva/sclera: Conjunctivae normal.     Pupils: Pupils are equal, round, and reactive to light.  Neck:     Thyroid: No thyromegaly.     Vascular: No carotid bruit or JVD.  Cardiovascular:     Rate and Rhythm: Normal rate and regular rhythm.     Pulses: Normal pulses.     Heart sounds: Normal heart sounds.    No gallop.  Pulmonary:     Effort: Pulmonary effort is normal. No respiratory distress.     Breath sounds: Normal breath sounds. No wheezing.     Comments: Good air exch Chest:     Chest wall: No tenderness.  Abdominal:     General: Bowel sounds are normal. There is no distension or abdominal bruit.     Palpations: Abdomen is soft. There is no mass.     Tenderness: There is no abdominal tenderness.     Hernia: No hernia is present.  Genitourinary:    Comments: Breast exam: No mass, nodules, thickening, tenderness, bulging, retraction, inflamation, nipple discharge or skin changes noted.  No axillary or clavicular LA.     Musculoskeletal:        General: No tenderness.  Normal range of motion.     Cervical back: Normal range of motion and neck supple. No rigidity. No muscular tenderness.     Right lower leg: No edema.     Left lower leg: No edema.     Comments: No kyphosis   Lymphadenopathy:     Cervical: No cervical adenopathy.  Skin:    General: Skin is warm and dry.     Coloration: Skin is not pale.     Findings: No erythema or rash.     Comments: Solar lentigines diffusely   Neurological:     Mental Status: She is alert. Mental status is at baseline.     Cranial Nerves: No cranial nerve deficit.  Motor: No abnormal muscle tone.     Coordination: Coordination normal.     Gait: Gait normal.     Deep Tendon Reflexes: Reflexes are normal and symmetric. Reflexes normal.  Psychiatric:        Attention and Perception: Attention normal.        Mood and Affect: Mood normal.        Cognition and Memory: Cognition and memory normal.          Assessment & Plan:   Problem List Items Addressed This Visit       Other   Insomnia    Pt takes daily  No side effects Aware of potential for habit       ADD (attention deficit disorder)    Doing well with adderall 10 mg bid  Urged good exercise and organization habits      Morbid obesity (HCC)    Discussed how this problem influences overall health and the risks it imposes  Reviewed plan for weight loss with lower calorie diet (via better food choices and also portion control or program like weight watchers) and exercise building up to or more than 30 minutes 5 days per week including some aerobic activity         Elevated TSH    No clinical changes -mild in past  TSH and FT4 today  Will continue to monitor       Relevant Orders   TSH   T4, free   Routine general medical examination at a health care facility - Primary    Reviewed health habits including diet and exercise and skin cancer prevention Reviewed appropriate screening tests for age  Also reviewed health mt list, fam hx and  immunization status , as well as social and family history   See HPI covid immunized  Pap utd, sent for last report from gyn  Mammogram ordered/first -pt will schedule Labs ordered  bp is stable       Relevant Orders   Comprehensive metabolic panel   CBC with Differential/Platelet   Lipid panel   TSH   Current use of proton pump inhibitor    Vit B12 and D added Pt has been unable to come off of it  Will continue to monitor  Disc risks: renal and OP      Relevant Orders   Vitamin B12   VITAMIN D 25 Hydroxy (Vit-D Deficiency, Fractures)   Encounter for screening mammogram for breast cancer    Scheduled annual screening mammogram Nl breast exam today  Encouraged monthly self exams        Relevant Orders   MM 3D SCREEN BREAST BILATERAL

## 2021-06-07 NOTE — Assessment & Plan Note (Signed)
Reviewed health habits including diet and exercise and skin cancer prevention Reviewed appropriate screening tests for age  Also reviewed health mt list, fam hx and immunization status , as well as social and family history   See HPI covid immunized  Pap utd, sent for last report from gyn  Mammogram ordered/first -pt will schedule Labs ordered  bp is stable

## 2021-06-07 NOTE — Assessment & Plan Note (Signed)
Pt takes daily  No side effects Aware of potential for habit

## 2021-06-07 NOTE — Assessment & Plan Note (Signed)
Doing well with adderall 10 mg bid  Urged good exercise and organization habits

## 2021-07-27 ENCOUNTER — Other Ambulatory Visit: Payer: Self-pay | Admitting: Family Medicine

## 2021-07-27 MED ORDER — AMPHETAMINE-DEXTROAMPHETAMINE 10 MG PO TABS: ORAL_TABLET | ORAL | 0 refills | Status: DC

## 2021-07-27 NOTE — Addendum Note (Signed)
Addended by: Jaquay Morneault A on: 09/16/2020 12:35 PM   Modules accepted: Orders  

## 2021-07-27 NOTE — Addendum Note (Signed)
Addended by: Shon Millet on: 07/27/2021 09:11 AM   Modules accepted: Orders

## 2021-07-27 NOTE — Telephone Encounter (Signed)
Name of Medication: Adderall  Name of Pharmacy: [facility] Last Fill or Written Date and Quantity:05/31/21 #60/ 0 refills Last Office Visit and Type: 06/07/21 CPE  Next Office Visit and Type: none scheduled

## 2021-07-27 NOTE — Telephone Encounter (Signed)
Pt needs a refill on amphetamine-dextroamphetamine (ADDERALL) 10 MG tablet [facility]

## 2021-08-28 ENCOUNTER — Other Ambulatory Visit: Payer: Self-pay | Admitting: Family Medicine

## 2021-08-29 NOTE — Telephone Encounter (Signed)
°  Encourage patient to contact the pharmacy for refills or they can request refills through [facility]  LAST APPOINTMENT DATE:  Please schedule appointment if longer than 1 year  NEXT APPOINTMENT DATE:  MEDICATION: zolpidem (AMBIEN) 10 MG tablet  Is the patient out of medication? yes  PH[facility]AC[facility] [address]  Let patient know to contact pharmacy at the end of the day to make sure medication is ready.  Please notify patient to allow 48-72 hours to process  CLINICAL FILLS OUT ALL BELOW:   LAST REFILL:  QTY:  REFILL DATE:    OTHER COMMENTS:    Okay for refill?  Please advise

## 2021-08-30 NOTE — Telephone Encounter (Signed)
Refill request Ambien Last refill 05/03/21 #30/3 Last office visit 05/30/21

## 2021-09-29 ENCOUNTER — Other Ambulatory Visit: Payer: Self-pay | Admitting: Family Medicine

## 2021-09-29 MED ORDER — AMPHETAMINE-DEXTROAMPHETAMINE 10 MG PO TABS
ORAL_TABLET | ORAL | 0 refills | Status: DC
Start: 2021-09-29 — End: 2021-11-15

## 2021-09-29 NOTE — Telephone Encounter (Signed)
Name of Medication: Adderall  Name of Pharmacy: [facility] Last Fill or Written Date and Quantity:07/27/21 #60/ 0 refills Last Office Visit and Type: 06/07/21 CPE  Next Office Visit and Type: none scheduled

## 2021-09-29 NOTE — Addendum Note (Signed)
Addended by: Tammi Sou on: 09/29/2021 10:25 AM   Modules accepted: Orders

## 2021-09-29 NOTE — Addendum Note (Signed)
Addended by: Roxy Manns A on: 09/29/2021 10:30 AM   Modules accepted: Orders

## 2021-09-29 NOTE — Telephone Encounter (Signed)
Pt needs a refill on amphetamine-dextroamphetamine (ADDERALL) 10 MG tablet [facility]  

## 2021-10-03 ENCOUNTER — Ambulatory Visit: Payer: 59 | Admitting: Podiatry

## 2021-10-18 ENCOUNTER — Encounter: Payer: Self-pay | Admitting: Nurse Practitioner

## 2021-10-18 ENCOUNTER — Ambulatory Visit: Payer: 59 | Admitting: Nurse Practitioner

## 2021-10-18 ENCOUNTER — Other Ambulatory Visit: Payer: Self-pay

## 2021-10-18 VITALS — BP 134/82 | HR 96 | Temp 97.3°F | Resp 14 | Ht 66.25 in | Wt 259.1 lb

## 2021-10-18 DIAGNOSIS — J069 Acute upper respiratory infection, unspecified: Secondary | ICD-10-CM | POA: Insufficient documentation

## 2021-10-18 MED ORDER — AZITHROMYCIN 250 MG PO TABS: ORAL_TABLET | ORAL | 0 refills | Status: AC

## 2021-10-18 MED ORDER — FLUTICASONE PROPIONATE 50 MCG/ACT NA SUSP: 2.0000 | Freq: Every day | NASAL | 0 refills | Status: DC

## 2021-10-18 NOTE — Patient Instructions (Signed)
Nice to see you today I sent in 2 prescriptions to the pharmacy. Also start taking Claritin for the next week at least Follow up if no improvement or symptoms worsen

## 2021-10-18 NOTE — Assessment & Plan Note (Signed)
Given length of symptoms patient presentation physical exam elect to treat with Azithromycin 250 mg pack.  Discussed signs and symptoms when to be seen again follow-up if no improvement.

## 2021-10-18 NOTE — Progress Notes (Signed)
Acute Office Visit  Subjective:    Patient ID: [name], female    DOB: May 16, 1981, 41 y.o.   MRN: [id number]  Chief Complaint  Patient presents with   Nasal Congestion    Sx x 3 weeks, runny nose now, at first had a lot of green mucus coming out and nasal congestion, sneezing now, eyes hurt , now has a dry nagging cough. No fever, No sore throat. Has taking Sudafed, Claritin D, Mucinex, Guafeinesin.     HPI Patient is in today for URI symptoms  Symptoms for the past 3 weeks No sick contacts that she knows of  Pfizer x2 and one booster Covid test last time was Saturday that was negative  Has been using OTC sudafed, claritin D, mucinex with no benefit LMP: end of last month expected next week  No past medical history on file.  No past surgical history on file.  No family history on file.  Social History   Socioeconomic History   Marital status: Married    Spouse name: Not on file   Number of children: Not on file   Years of education: Not on file   Highest education level: Not on file  Occupational History   Not on file  Tobacco Use   Smoking status: Never   Smokeless tobacco: Never  Substance and Sexual Activity   Alcohol use: Yes    Alcohol/week: 0.0 standard drinks    Comment: occasional    Drug use: No   Sexual activity: Not on file  Other Topics Concern   Not on file  Social History Narrative   Not on file   Social Determinants of Health   Financial Resource Strain: Not on file  Food Insecurity: Not on file  Transportation Needs: Not on file  Physical Activity: Not on file  Stress: Not on file  Social Connections: Not on file  Intimate Partner Violence: Not on file    Outpatient Medications Prior to Visit  Medication Sig Dispense Refill   amphetamine-dextroamphetamine (ADDERALL) 10 MG tablet Take one pill by mouth every am and one at lunchtime 60 tablet 0   levonorgestrel-ethinyl estradiol (ALTAVERA) 0.15-30 MG-MCG tablet Take 1 tablet by  mouth daily. 84 tablet 3   omeprazole (PRILOSEC) 20 MG capsule Take 1 capsule (20 mg total) by mouth daily. 90 capsule 3   zolpidem (AMBIEN) 10 MG tablet TAKE 1 TABLET BY MOUTH EVERY DAY AT BEDTIME AS NEEDED FOR SLEEP 30 tablet 3   No facility-administered medications prior to visit.    No Known Allergies  Review of Systems  Constitutional:  Negative for appetite change, chills, fatigue and fever.  HENT:  Positive for congestion, rhinorrhea, sinus pressure and sinus pain. Negative for ear discharge, ear pain, postnasal drip and sore throat.   Respiratory:  Positive for cough (dry cough). Negative for shortness of breath.   Cardiovascular:  Negative for chest pain.  Gastrointestinal:  Negative for abdominal pain, diarrhea, nausea and vomiting.  Musculoskeletal:  Negative for arthralgias and myalgias.  Neurological:  Negative for headaches.      Objective:    Physical Exam Vitals and nursing note reviewed.  Constitutional:      Appearance: Normal appearance.  HENT:     Right Ear: Tympanic membrane, ear canal and external ear normal.     Left Ear: Tympanic membrane, ear canal and external ear normal.     Nose:     Right Sinus: No maxillary sinus tenderness or frontal sinus tenderness.  Left Sinus: No maxillary sinus tenderness or frontal sinus tenderness.     Mouth/Throat:     Mouth: Mucous membranes are moist.     Pharynx: No posterior oropharyngeal erythema.  Cardiovascular:     Rate and Rhythm: Normal rate and regular rhythm.     Heart sounds: Normal heart sounds.  Pulmonary:     Effort: Pulmonary effort is normal.     Breath sounds: Normal breath sounds.  Abdominal:     General: Bowel sounds are normal.  Lymphadenopathy:     Cervical: Cervical adenopathy (anterior) present.  Neurological:     Mental Status: She is alert.    BP 134/82    Pulse 96    Temp (!) 97.3 F (36.3 C)    Resp 14    Ht 5' 6.25" (1.683 m)    Wt 259 lb 1 oz (117.5 kg)    LMP 09/19/2021    SpO2  99%    BMI 41.50 kg/m  Wt Readings from Last 3 Encounters:  10/18/21 259 lb 1 oz (117.5 kg)  06/07/21 263 lb 4 oz (119.4 kg)  06/15/20 261 lb 9 oz (118.6 kg)    Health Maintenance Due  Topic Date Due   MAMMOGRAM  Never done   Hepatitis C Screening  Never done   COVID-19 Vaccine (4 - Booster for Pfizer series) 08/04/2020    There are no preventive care reminders to display for this patient.   Lab Results  Component Value Date   TSH 4.36 06/07/2021   Lab Results  Component Value Date   WBC 6.4 06/07/2021   HGB 12.6 06/07/2021   HCT 38.6 06/07/2021   MCV 80.8 06/07/2021   PLT 230.0 06/07/2021   Lab Results  Component Value Date   NA 137 06/07/2021   K 4.4 06/07/2021   CO2 26 06/07/2021   GLUCOSE 84 06/07/2021   BUN 9 06/07/2021   CREATININE 0.81 06/07/2021   BILITOT 0.5 06/07/2021   ALKPHOS 54 06/07/2021   AST 13 06/07/2021   ALT 11 06/07/2021   PROT 6.7 06/07/2021   ALBUMIN 4.0 06/07/2021   CALCIUM 8.9 06/07/2021   GFR 90.35 06/07/2021   Lab Results  Component Value Date   CHOL 181 06/07/2021   Lab Results  Component Value Date   HDL 49.00 06/07/2021   Lab Results  Component Value Date   LDLCALC 112 (H) 06/07/2021   Lab Results  Component Value Date   TRIG 99.0 06/07/2021   Lab Results  Component Value Date   CHOLHDL 4 06/07/2021   No results found for: HGBA1C     Assessment & Plan:   Problem List Items Addressed This Visit       Respiratory   Upper respiratory tract infection - Primary    Given length of symptoms patient presentation physical exam elect to treat with Azithromycin 250 mg pack.  Discussed signs and symptoms when to be seen again follow-up if no improvement.      Relevant Medications   azithromycin (ZITHROMAX) 250 MG tablet   fluticasone (FLONASE) 50 MCG/ACT nasal spray     Meds ordered this encounter  Medications   azithromycin (ZITHROMAX) 250 MG tablet    Sig: Take 2 tablets on day 1, then 1 tablet daily on days 2  through 5    Dispense:  6 tablet    Refill:  0    Order Specific Question:   Supervising Provider    Answer:   Roxy Manns A [[id number]]  fluticasone (FLONASE) 50 MCG/ACT nasal spray    Sig: Place 2 sprays into both nostrils daily.    Dispense:  16 g    Refill:  0    Order Specific Question:   Supervising Provider    Answer:   Roxy Manns A [[id number]]   This visit occurred during the SARS-CoV-2 public health emergency.  Safety protocols were in place, including screening questions prior to the visit, additional usage of staff PPE, and extensive cleaning of exam room while observing appropriate contact time as indicated for disinfecting solutions.    Audria Nine, NP

## 2021-11-09 ENCOUNTER — Other Ambulatory Visit: Payer: Self-pay | Admitting: Nurse Practitioner

## 2021-11-09 DIAGNOSIS — J069 Acute upper respiratory infection, unspecified: Secondary | ICD-10-CM

## 2021-11-15 ENCOUNTER — Other Ambulatory Visit: Payer: Self-pay | Admitting: Family Medicine

## 2021-11-15 MED ORDER — AMPHETAMINE-DEXTROAMPHETAMINE 10 MG PO TABS: ORAL_TABLET | ORAL | 0 refills | Status: DC

## 2021-11-15 NOTE — Telephone Encounter (Signed)
?  Encourage patient to contact the pharmacy for refills or they can request refills through [facility] ? ?LAST APPOINTMENT DATE:  Please schedule appointment if longer than 1 year ? ?NEXT APPOINTMENT DATE: ? ?MEDICATION:amphetamine-dextroamphetamine (ADDERALL) 10 MG tablet ? ?Is the patient out of medication?  ? ?[id number] [place], [place] - [address] ? ?Let patient know to contact pharmacy at the end of the day to make sure medication is ready. ? ?Please notify patient to allow 48-72 hours to process ? ?CLINICAL FILLS OUT ALL BELOW:  ? ?LAST REFILL: ? ?QTY: ? ?REFILL DATE: ? ? ? ?OTHER COMMENTS:  ? ? ?Okay for refill? ? ?Please advise ? ? ?  ?

## 2021-11-15 NOTE — Telephone Encounter (Signed)
ame of Medication: Adderall  ?Name of Pharmacy: [facility] ?Last Fill or Written Date and Quantity: 09/29/21 #60/ 0 refills ?Last Office Visit and Type: Congestion on 10/18/21 (06/07/21 CPE)  ?Next Office Visit and Type: none scheduled ?

## 2021-12-15 ENCOUNTER — Ambulatory Visit: Payer: 59 | Admitting: Podiatry

## 2021-12-15 ENCOUNTER — Encounter: Payer: Self-pay | Admitting: Podiatry

## 2021-12-15 ENCOUNTER — Ambulatory Visit (INDEPENDENT_AMBULATORY_CARE_PROVIDER_SITE_OTHER): Payer: 59

## 2021-12-15 DIAGNOSIS — M7662 Achilles tendinitis, left leg: Secondary | ICD-10-CM

## 2021-12-15 DIAGNOSIS — M722 Plantar fascial fibromatosis: Secondary | ICD-10-CM

## 2021-12-15 DIAGNOSIS — M7661 Achilles tendinitis, right leg: Secondary | ICD-10-CM

## 2021-12-15 MED ORDER — METHYLPREDNISOLONE 4 MG PO TBPK
ORAL_TABLET | ORAL | 0 refills | Status: DC
Start: 2021-12-15 — End: 2022-01-10

## 2021-12-15 MED ORDER — CELECOXIB 200 MG PO CAPS: 200.0000 mg | ORAL_CAPSULE | Freq: Two times a day (BID) | ORAL | 1 refills | Status: DC

## 2021-12-15 NOTE — Progress Notes (Signed)
Damesha presents today after having not seen her for years so with a flareup of Planter fasciitis and Achilles tendinitis states that it really just never went away completely she continues to wear her boots her orthotics all of her conservative therapies including steroid also nonsteroidals that she has been trying in the past but nothing seems to be alleviating the symptoms at this time.  She states that she cannot even lay her posterior heel down on the bed at night because it hurts so badly.  She still having more significant pain in the plantar fascial area and then posteriorly though. ? 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  Severe pain on palpation medial calcaneal tubercle and posterior retrocalcaneal tubercle particularly on the right over the left.  Radiographs do demonstrate thickening of the plantar fascia and the Achilles near its insertion site angular small fragment has broken in the posterior aspect of the left heel near the insertion of the Achilles. ? ?Assessment: Chronic intractable Achilles tendinitis probable tear of the plantar fascia and cannot rule out a tear of the Achilles bilaterally. ? 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  I do feel that most likely Achilles and the plantar fascia or torn since we have failed to alleviate her symptoms over this long duration.  I will follow-up with her once these MRIs come back. ?

## 2021-12-25 ENCOUNTER — Other Ambulatory Visit: Payer: Self-pay | Admitting: Family Medicine

## 2021-12-26 ENCOUNTER — Ambulatory Visit
Admission: RE | Admit: 2021-12-26 | Discharge: 2021-12-26 | Disposition: A | Discharge status: Home / Self Care | Payer: 59 | Source: Ambulatory Visit | Attending: Podiatry | Admitting: Podiatry

## 2021-12-26 DIAGNOSIS — M7662 Achilles tendinitis, left leg: Secondary | ICD-10-CM

## 2021-12-26 DIAGNOSIS — M722 Plantar fascial fibromatosis: Secondary | ICD-10-CM

## 2021-12-26 IMAGING — MR MR ANKLE*L* W/O CM
4 of 5 series · 13 of 40 positions shown · non-contrast
Comparison: Left foot radiographs [DATE]

CLINICAL DATA: Ankle pain. Tendon abnormality suspected. Evaluate
for plantar fascia rupture and Achilles tendon tear. Surgical
consideration. Heel pain. History of surgery to left toes.

EXAM:
MRI OF THE LEFT ANKLE WITHOUT CONTRAST
TECHNIQUE: Multiplanar, multisequence MR imaging of the ankle was performed. No
intravenous contrast was administered.

[Series 3: T1 · axial · left · 3.0mm · 0.25mm/px · z∈[-71,+27]mm · 3 of 33 slices shown (1 of 2)]
[im 4/33]
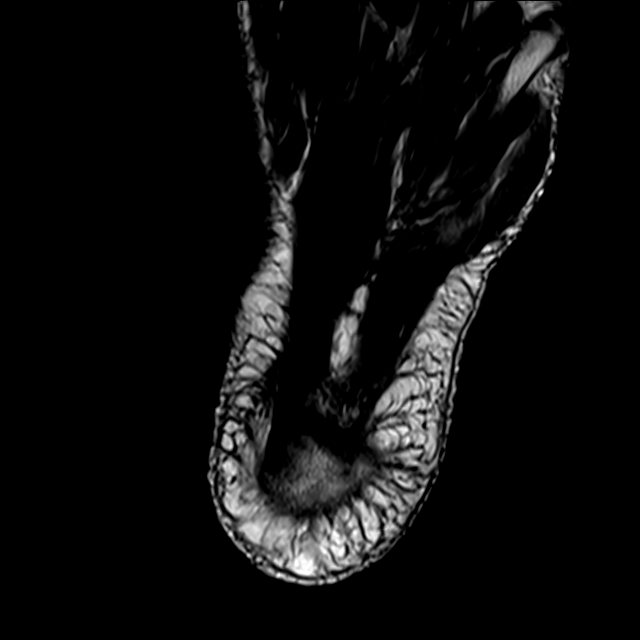
[im 18/33]
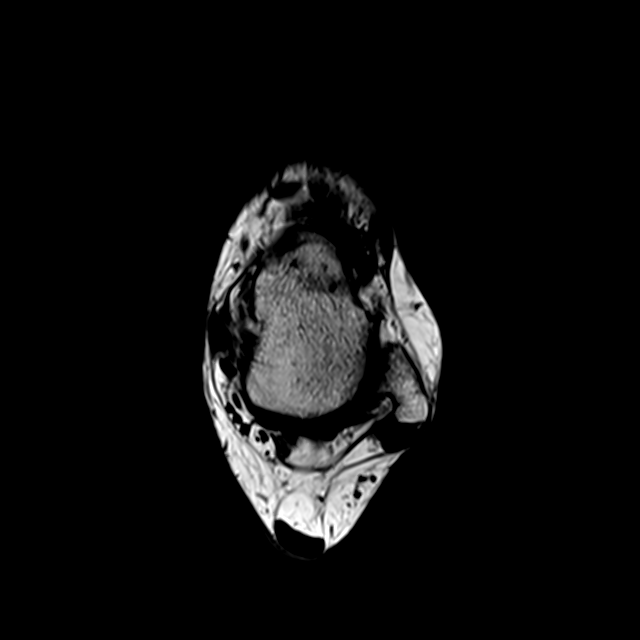
[im 29/33]
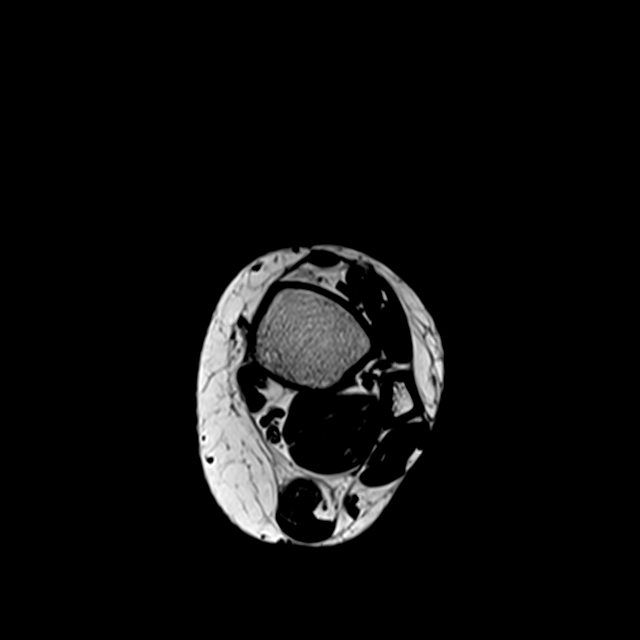

[Series 4: T2 fat-sat · axial · left · 3.0mm · 0.25mm/px · z∈[-82,+27]mm · 4 of 33 slices shown (1 of 2)]
[im 1/33]
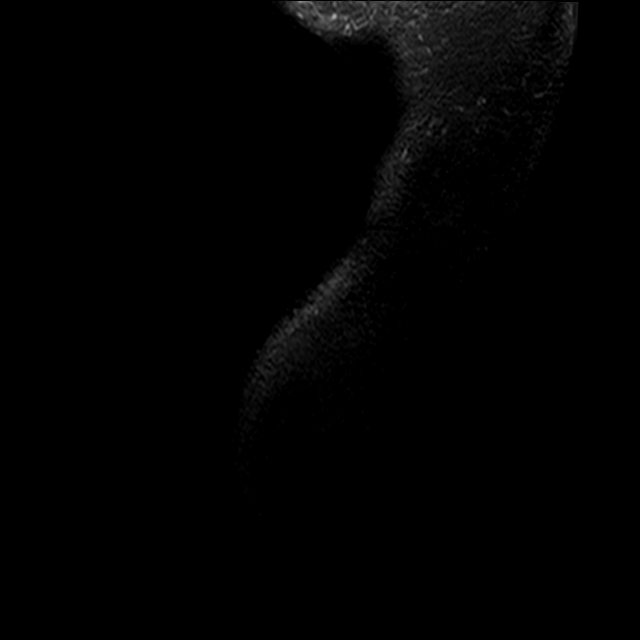
[im 4/33]
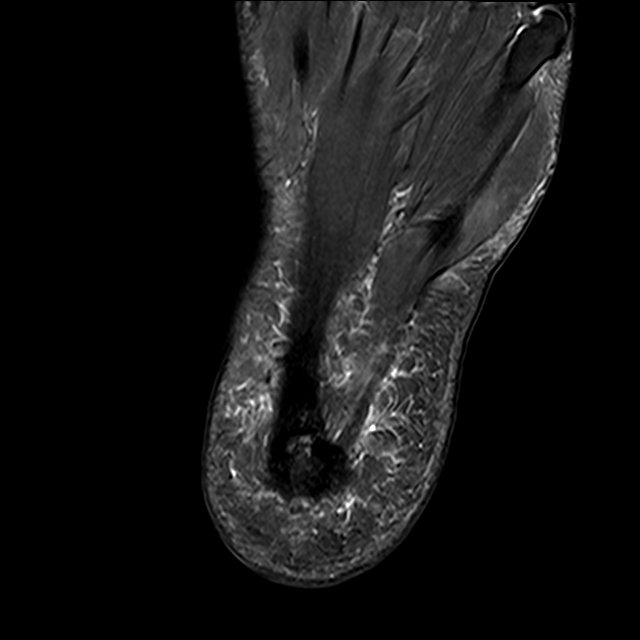
[im 18/33]
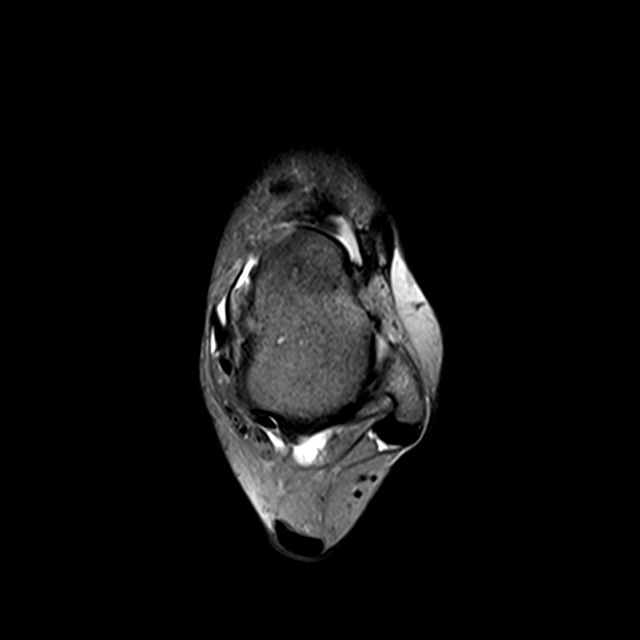
[im 29/33]
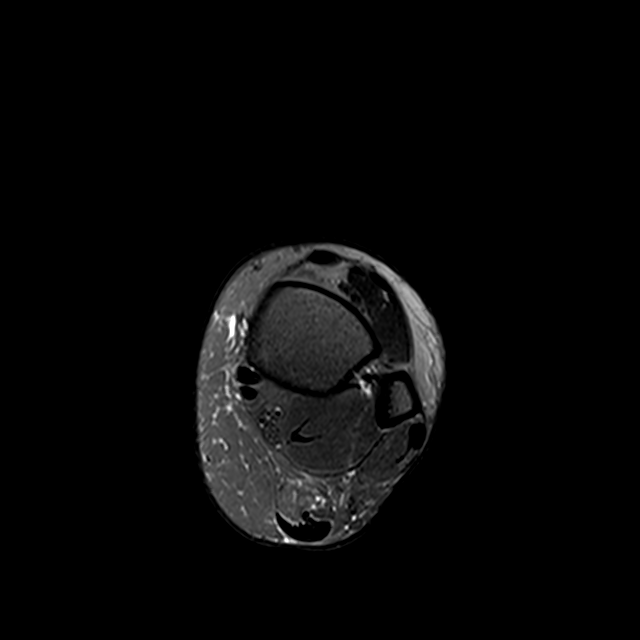

[Series 5: T1 · sagittal · left · 4.0mm · 0.27mm/px · 3 of 19 slices shown (2 of 2)]
[im 1/19]
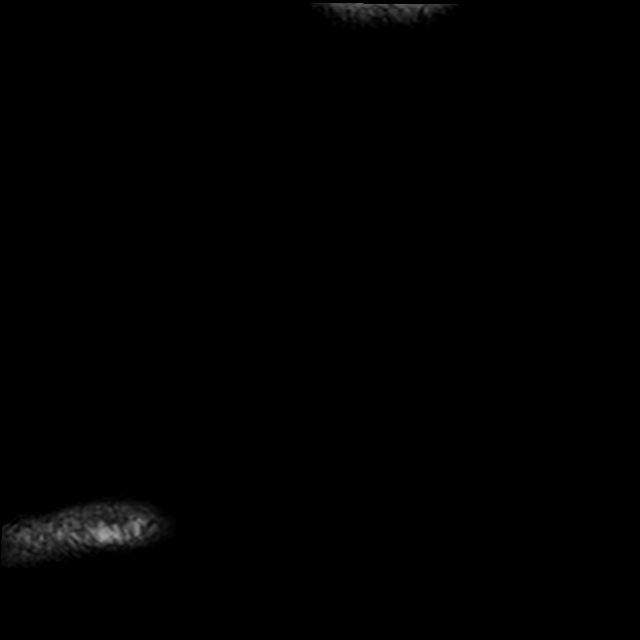
[im 10/19]
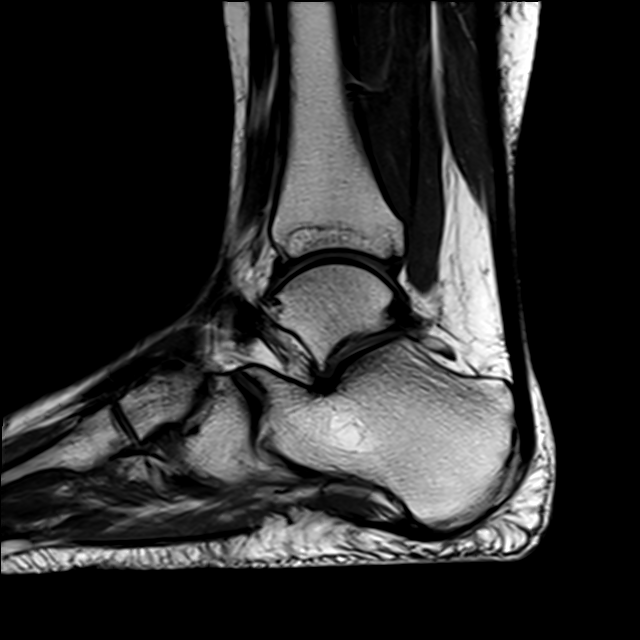
[im 19/19]
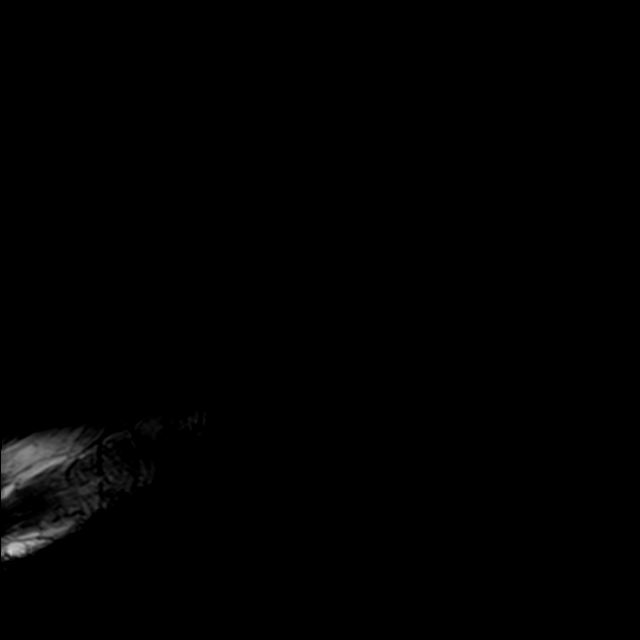

[Series 7: T2 fat-sat · coronal · left · 3.0mm · 0.25mm/px · 3 of 35 slices shown (2 of 2)]
[im 4/35]
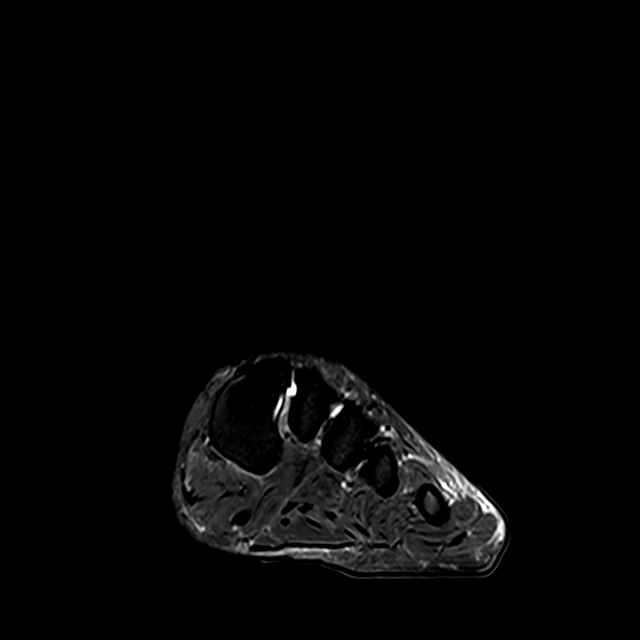
[im 19/35]
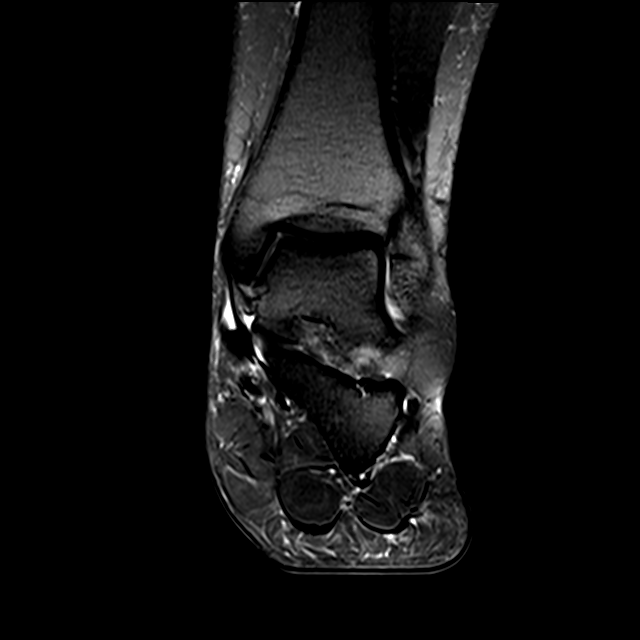
[im 31/35]
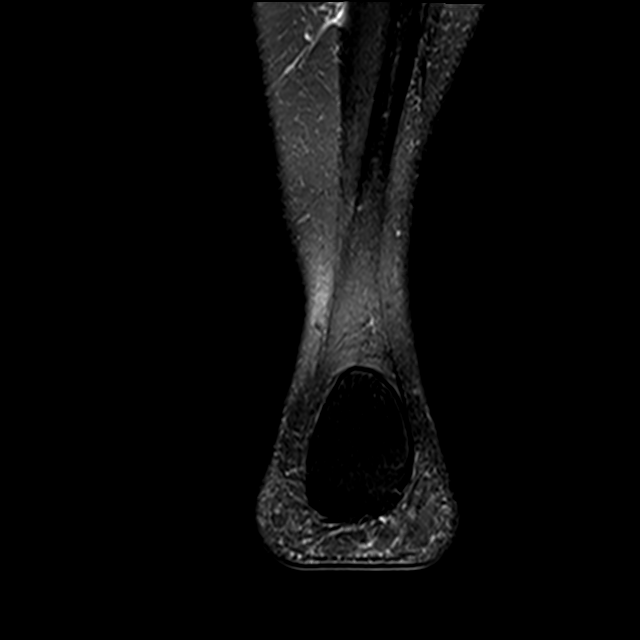

[13 of 40 positions shown; findings below may reference images not displayed]

FINDINGS: TENDONS

Peroneal: The peroneus longus and brevis tendons are intact.

Posteromedial: Intact tibialis posterior, flexor digitorum longus,
and flexor hallucis longus tendons.

Anterior: The tibialis anterior, extensor hallucis longus, and
extensor digitorum longus tendons are intact.

Achilles: Intact.

Plantar Fascia: Minimal intermediate T2 signal within the origin of
the medial band of the plantar fascia with trace increased T2 signal
likely fluid just deep to the plantar fascia origin and small
plantar calcaneal heel spur (sagittal STIR series 6, image 11).
Possible tiny 2 mm fluid bright partial-thickness (sagittal series
6, image 10). Plantar fascia retraction. Minimal adjacent calcaneal
marrow edema (sagittal series 6, image 10).

LIGAMENTS

Lateral: The anterior and posterior talofibular, anterior and
posterior tibiofibular, and calcaneofibular ligaments are intact.

Medial: The tibiotalar deep deltoid and tibial spring ligaments are
intact.

CARTILAGE

Ankle Joint: Intact cartilage.

Subtalar Joints/Sinus Tarsi: Fat is preserved within sinus tarsi.

Bones: No acute fracture.

Other: The tarsal tunnel is unremarkable. The Lisfranc ligament
complex is intact.
IMPRESSION: Minimal inflammatory change within the origin of the medial band of
the plantar fascia with minimal adjacent calcaneal marrow edema.
Possible tiny 2 mm fluid bright partial-thickness tear. Small
plantar calcaneal heel spur.

Intact Achilles tendon.

## 2021-12-26 IMAGING — MR MR ANKLE*R* W/O CM
4 of 5 series · 13 of 40 positions shown · non-contrast
Comparison: Right foot radiographs [DATE]

CLINICAL DATA: Ankle pain. Tendon abnormality suspected. Evaluate
for plantar fascia rupture an Achilles tendon tear. Surgical
consideration. Greater than 5 years of heel pain.

EXAM:
MRI OF THE RIGHT ANKLE WITHOUT CONTRAST
TECHNIQUE: Multiplanar, multisequence MR imaging of the ankle was performed. No
intravenous contrast was administered.

[Series 3: T2 fat-sat · axial · right · 3.0mm · 0.25mm/px · z∈[-76,+24]mm · 4 of 30 slices shown (1 of 2)]
[im 1/30]
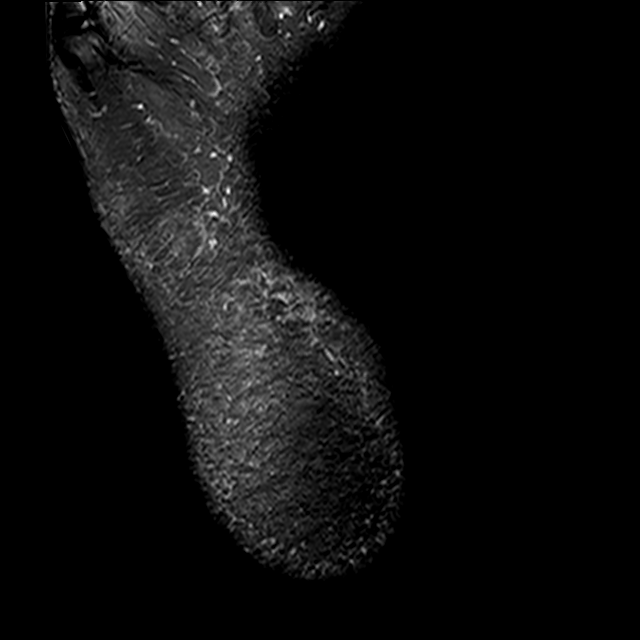
[im 4/30]
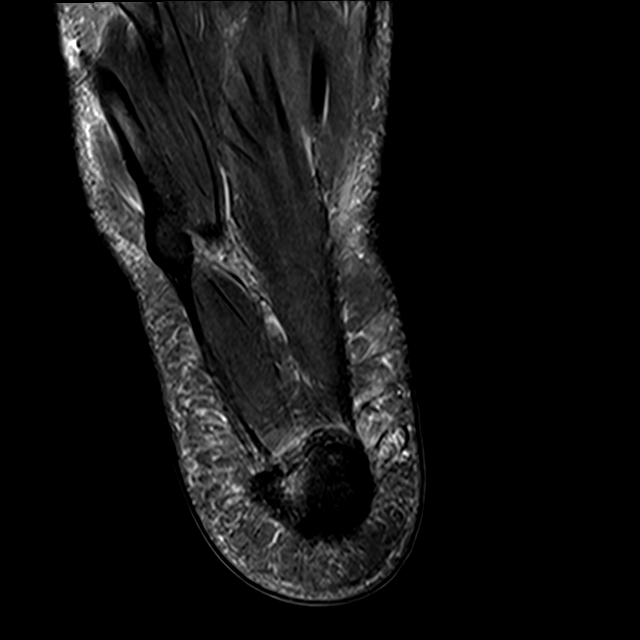
[im 15/30]
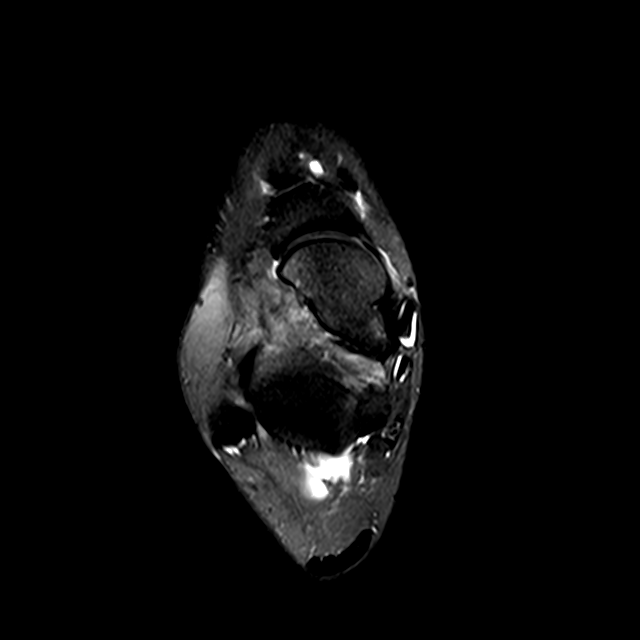
[im 26/30]
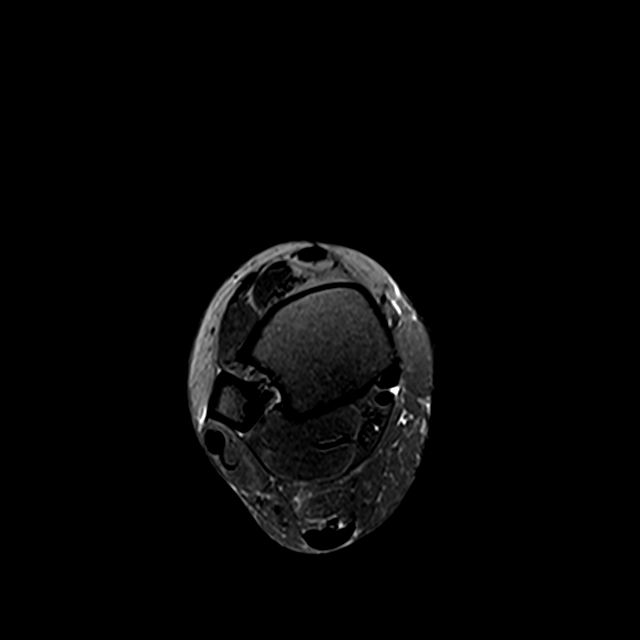

[Series 4: T1 · axial · right · 3.0mm · 0.25mm/px · z∈[-64,+24]mm · 3 of 30 slices shown (1 of 2)]
[im 4/30]
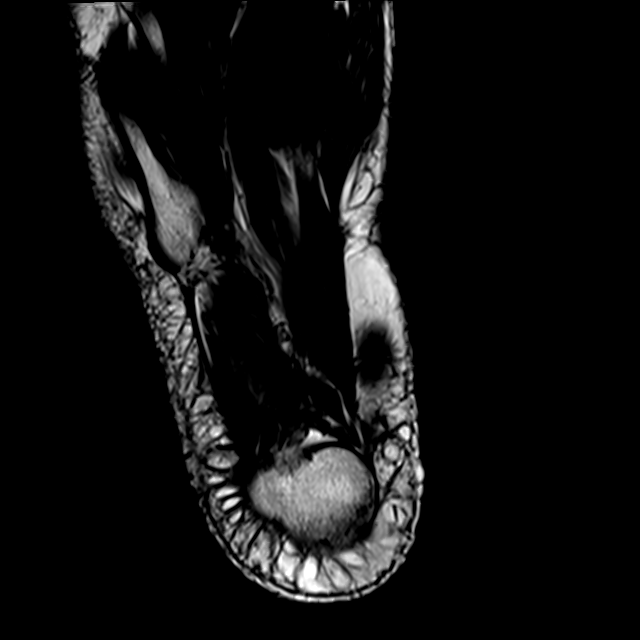
[im 15/30]
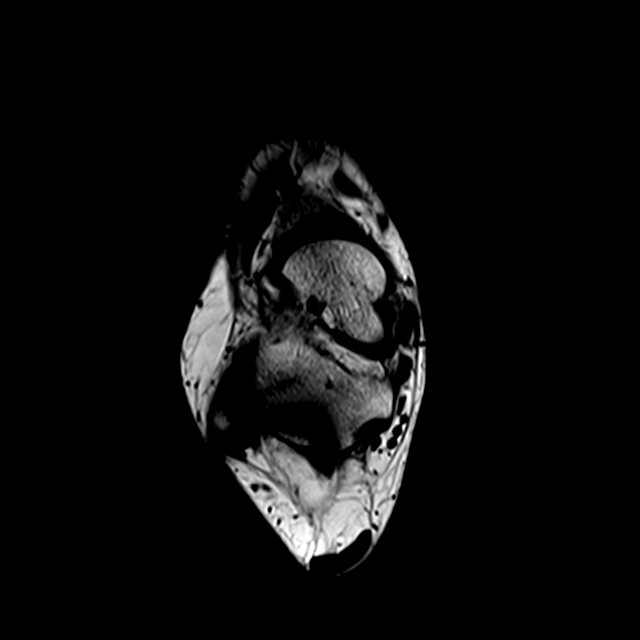
[im 26/30]
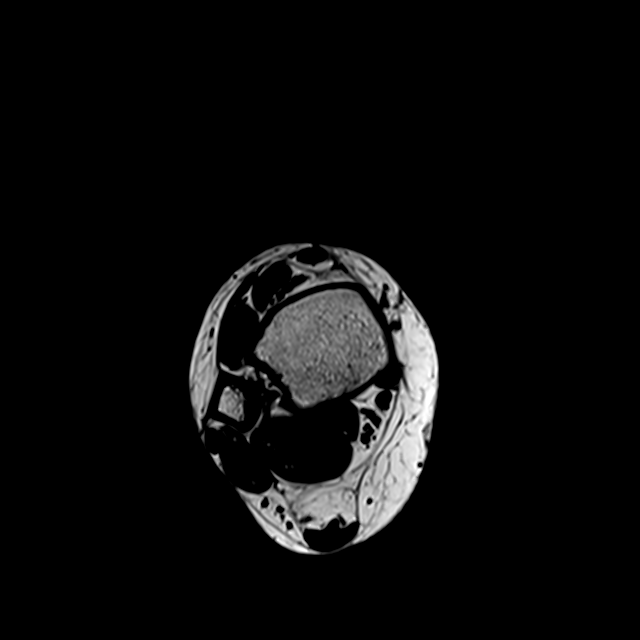

[Series 5: T1 · sagittal · right · 4.0mm · 0.27mm/px · 3 of 18 slices shown (2 of 2)]
[im 4/18]
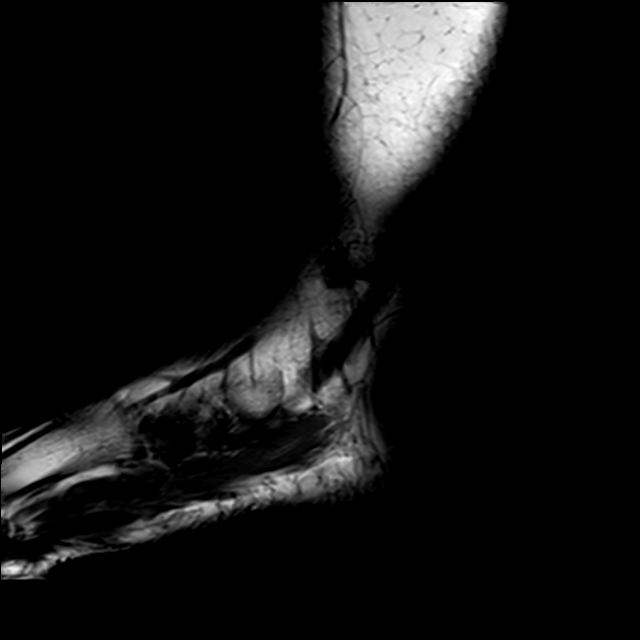
[im 11/18]
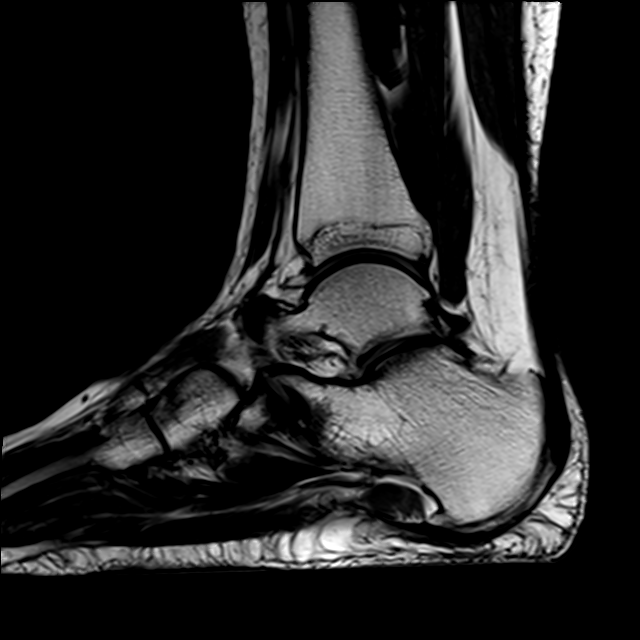
[im 18/18]
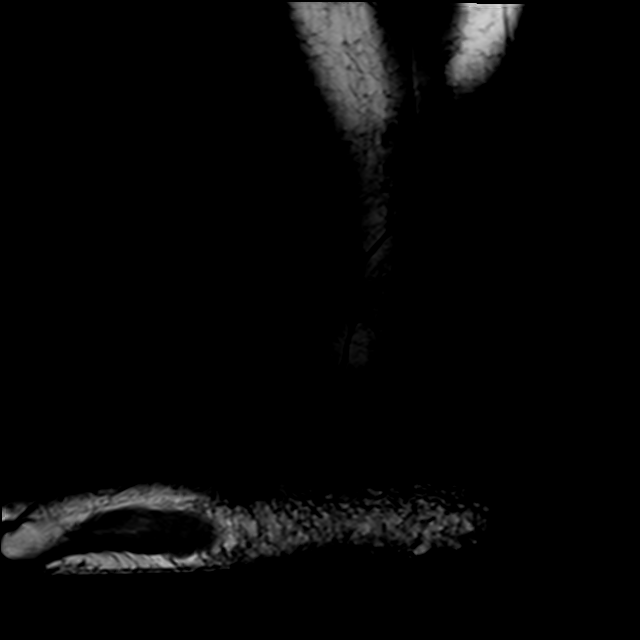

[Series 7: T2 fat-sat · coronal · right · 3.0mm · 0.25mm/px · 3 of 33 slices shown (2 of 2)]
[im 4/33]
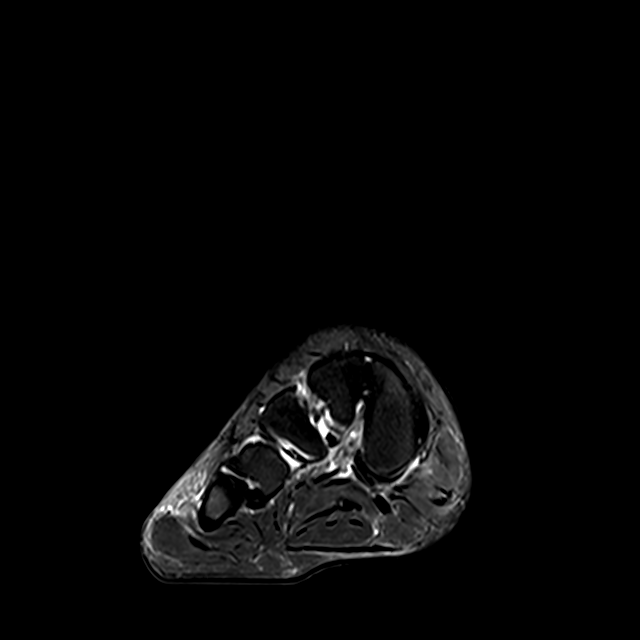
[im 18/33]
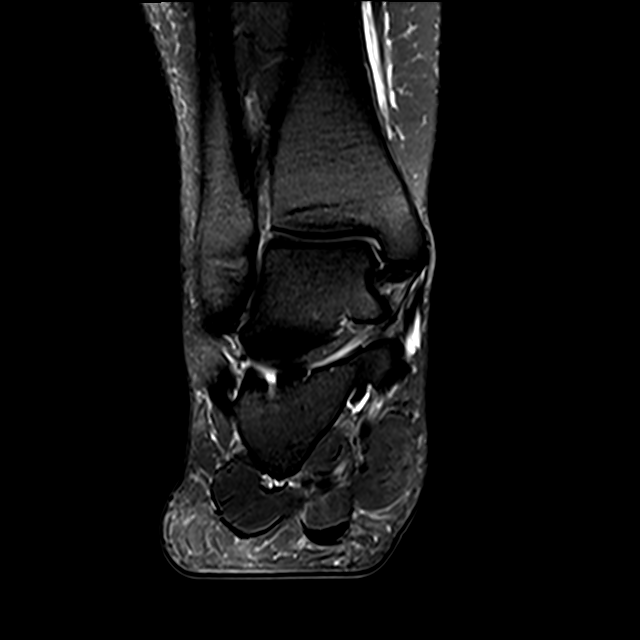
[im 29/33]
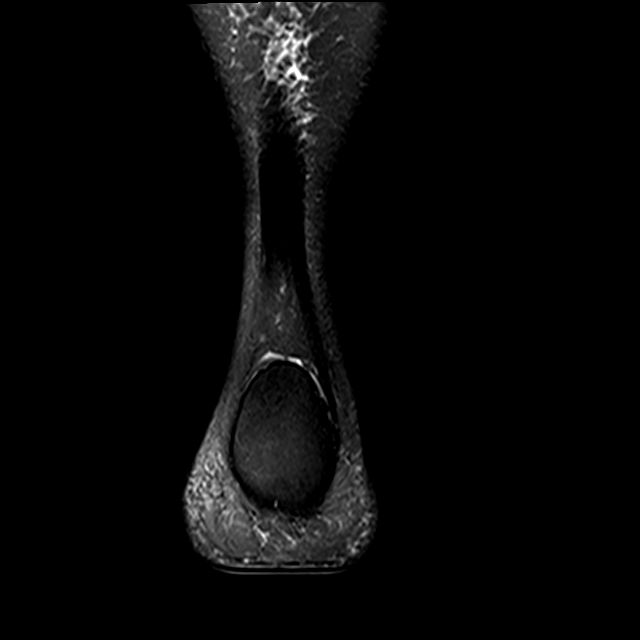

[13 of 40 positions shown; findings below may reference images not displayed]

FINDINGS: TENDONS

Peroneal: The peroneus longus and brevis tendons are intact.

Posteromedial: Intact tibialis posterior, flexor digitorum longus,
and flexor hallucis longus tendons.

Anterior: The tibialis anterior, extensor hallucis longus, and
extensor digitorum longus tendons are intact.

Achilles: Intact.

Plantar Fascia: Mild-to-moderate plantar calcaneal heel spur. Mild
chronic thickening of the medial band of the plantar fascia with
normal low signal. Mild edema and possible trace fluid deep to the
plantar fascia origin (coronal series 7, image 11 and sagittal
series 6 images 11 through 13). No fluid bright tear or plantar
fascia retraction.

LIGAMENTS

Lateral: There is moderate attenuation of the anterior talofibular
ligament likely remote partial-thickness tear. The calcaneofibular,
posterior talofibular, and anterior and posterior tibiofibular
ligaments are intact.

Medial: The tibiotalar deep deltoid and tibial spring ligaments are
intact.

CARTILAGE

Ankle Joint: Intact cartilage.

Subtalar Joints/Sinus Tarsi: Fat is preserved within sinus tarsi.

Bones: There is again a well corticated chronic ossicle at the
posterior dorsal aspect of the navicular, either the sequela of
remote trauma or a developmental accessory ossicle. Mild-to-moderate
thinning of the tarsometatarsal joint spaces.

Other: The tarsal tunnel is unremarkable. The Lisfranc ligament
complex is intact.
IMPRESSION: :
IMPRESSION: 1. Minimal plantar fasciitis. Mild-to-moderate plantar calcaneal
heel spur.
2. Intact Achilles tendon.
3. Remote partial-thickness tear of the anterior talofibular
ligament.

## 2021-12-26 NOTE — Telephone Encounter (Signed)
Lasted filled on 08/31/21 ? Last ov 202/28/23 ?Next ov  none  ?

## 2022-01-10 ENCOUNTER — Ambulatory Visit: Payer: 59

## 2022-01-10 ENCOUNTER — Encounter: Payer: Self-pay | Admitting: Podiatry

## 2022-01-10 ENCOUNTER — Ambulatory Visit: Payer: 59 | Admitting: Podiatry

## 2022-01-10 DIAGNOSIS — M7661 Achilles tendinitis, right leg: Secondary | ICD-10-CM

## 2022-01-10 DIAGNOSIS — M7662 Achilles tendinitis, left leg: Secondary | ICD-10-CM | POA: Diagnosis not present

## 2022-01-10 DIAGNOSIS — M722 Plantar fascial fibromatosis: Secondary | ICD-10-CM

## 2022-01-10 NOTE — Progress Notes (Signed)
She presents today for follow-up of her MRI results.  Objective: MRI results demonstrate medial band Planter fasciitis bilateral otherwise relatively insignificant findings.  Assessment: Planter fasciitis medial band bilateral.  Plan: We discussed in great detail today options for this at this been going on for quite a long time I do feel that the best thing to do initially would be to get a new pair of orthotics built and try those.  Should this fail to alleviate her symptoms then we may need to consider surgical intervention which we discussed as well.

## 2022-01-10 NOTE — Progress Notes (Signed)
SITUATION Reason for Consult: Evaluation for Bilateral Custom Foot Orthoses Patient / Caregiver Report: Patient is ready for foot orthotics  OBJECTIVE DATA: Patient History / Diagnosis:    ICD-10-CM   1. Plantar fasciitis  M72.2       Current or Previous Devices:   Current user  Foot Examination: Skin presentation:   Intact Ulcers & Callousing:   None Toe / Foot Deformities:  None Weight Bearing Presentation:  Rectus Sensation:    Intact  Shoe Size:    28M  ORTHOTIC RECOMMENDATION Recommended Device: 1x pair of custom functional foot orthotics  GOALS OF ORTHOSES - Reduce Pain - Prevent Foot Deformity - Prevent Progression of Further Foot Deformity - Relieve Pressure - Improve the Overall Biomechanical Function of the Foot and Lower Extremity.  ACTIONS PERFORMED Potential out of pocket cost was communicated to patient. Patient understood and consent to casting. Patient was casted for Foot Orthoses via crush box. Procedure was explained and patient tolerated procedure well. Casts were shipped to central fabrication. All questions were answered and concerns addressed.  PLAN Patient is to be called for fitting when devices are ready.

## 2022-01-20 ENCOUNTER — Other Ambulatory Visit: Payer: Self-pay | Admitting: Family Medicine

## 2022-01-20 MED ORDER — AMPHETAMINE-DEXTROAMPHETAMINE 10 MG PO TABS: ORAL_TABLET | ORAL | 0 refills | Status: DC

## 2022-01-20 NOTE — Telephone Encounter (Signed)
Name of Medication: Adderall  Name of Pharmacy: [facility] Last Fill or Written Date and Quantity: 11/15/21 #60/ 0 refills Last Office Visit and Type: Congestion on 10/18/21 (06/07/21 CPE)  Next Office Visit and Type: none scheduled

## 2022-01-20 NOTE — Telephone Encounter (Signed)
Caller Name: Zyion Leidner Call back phone #: [phone]  MEDICATION(S): amphetamine-dextroamphetamine (ADDERALL) 10 MG tablet   Days of Med Remaining: out  Has the patient contacted their pharmacy (YES/NO)?  No, controlled IF YES, when and what did the pharmacy advise?  IF NO, request that the patient contact the pharmacy for the refills in the future.             The pharmacy will send an electronic request (except for controlled medications).  [facility] [address]   ~~~Please advise patient/caregiver to allow 2-3 business days to process RX refills.

## 2022-02-08 ENCOUNTER — Other Ambulatory Visit: Payer: Self-pay | Admitting: Podiatry

## 2022-02-13 ENCOUNTER — Other Ambulatory Visit: Payer: Self-pay

## 2022-02-13 ENCOUNTER — Emergency Department: Payer: 59

## 2022-02-13 ENCOUNTER — Ambulatory Visit
Admission: RE | Admit: 2022-02-13 | Discharge: 2022-02-13 | Disposition: A | Discharge status: Home / Self Care | Payer: 59 | Source: Ambulatory Visit | Attending: Emergency Medicine | Admitting: Emergency Medicine

## 2022-02-13 ENCOUNTER — Observation Stay
Admission: EM | Admit: 2022-02-13 | Discharge: 2022-02-15 | Disposition: A | Discharge status: Home / Self Care | Payer: 59 | Attending: Internal Medicine | Admitting: Internal Medicine

## 2022-02-13 ENCOUNTER — Encounter: Payer: Self-pay | Admitting: Internal Medicine

## 2022-02-13 ENCOUNTER — Telehealth: Payer: Self-pay

## 2022-02-13 VITALS — BP 129/84 | HR 90 | Temp 98.0°F | Resp 18

## 2022-02-13 DIAGNOSIS — Z791 Long term (current) use of non-steroidal anti-inflammatories (NSAID): Secondary | ICD-10-CM

## 2022-02-13 DIAGNOSIS — G4700 Insomnia, unspecified: Secondary | ICD-10-CM | POA: Diagnosis present

## 2022-02-13 DIAGNOSIS — K219 Gastro-esophageal reflux disease without esophagitis: Secondary | ICD-10-CM | POA: Diagnosis present

## 2022-02-13 DIAGNOSIS — E66811 Obesity, class 1: Secondary | ICD-10-CM | POA: Diagnosis present

## 2022-02-13 DIAGNOSIS — Z8639 Personal history of other endocrine, nutritional and metabolic disease: Secondary | ICD-10-CM | POA: Diagnosis present

## 2022-02-13 DIAGNOSIS — R079 Chest pain, unspecified: Principal | ICD-10-CM | POA: Diagnosis present

## 2022-02-13 DIAGNOSIS — R7989 Other specified abnormal findings of blood chemistry: Secondary | ICD-10-CM | POA: Diagnosis present

## 2022-02-13 DIAGNOSIS — R0789 Other chest pain: Secondary | ICD-10-CM | POA: Diagnosis present

## 2022-02-13 DIAGNOSIS — R002 Palpitations: Secondary | ICD-10-CM

## 2022-02-13 DIAGNOSIS — R030 Elevated blood-pressure reading, without diagnosis of hypertension: Secondary | ICD-10-CM | POA: Diagnosis not present

## 2022-02-13 DIAGNOSIS — Z6841 Body Mass Index (BMI) 40.0 and over, adult: Secondary | ICD-10-CM | POA: Insufficient documentation

## 2022-02-13 DIAGNOSIS — F988 Other specified behavioral and emotional disorders with onset usually occurring in childhood and adolescence: Secondary | ICD-10-CM | POA: Diagnosis not present

## 2022-02-13 DIAGNOSIS — Z79899 Other long term (current) drug therapy: Secondary | ICD-10-CM

## 2022-02-13 DIAGNOSIS — R778 Other specified abnormalities of plasma proteins: Secondary | ICD-10-CM | POA: Diagnosis present

## 2022-02-13 DIAGNOSIS — F909 Attention-deficit hyperactivity disorder, unspecified type: Secondary | ICD-10-CM | POA: Insufficient documentation

## 2022-02-13 DIAGNOSIS — Z79818 Long term (current) use of other agents affecting estrogen receptors and estrogen levels: Secondary | ICD-10-CM

## 2022-02-13 DIAGNOSIS — E6609 Other obesity due to excess calories: Secondary | ICD-10-CM | POA: Diagnosis present

## 2022-02-13 HISTORY — DX: Migraine, unspecified, not intractable, without status migrainosus: G43.909

## 2022-02-13 HISTORY — DX: Insomnia, unspecified: G47.00

## 2022-02-13 HISTORY — DX: Other specified behavioral and emotional disorders with onset usually occurring in childhood and adolescence: F98.8

## 2022-02-13 HISTORY — DX: Elevated blood-pressure reading, without diagnosis of hypertension: R03.0

## 2022-02-13 HISTORY — DX: Gastro-esophageal reflux disease without esophagitis: K21.9

## 2022-02-13 HISTORY — DX: Other specified abnormal findings of blood chemistry: R79.89

## 2022-02-13 LAB — BASIC METABOLIC PANEL
Anion gap: 6 (ref 5–15)
BUN: 7 mg/dL (ref 6–20)
CO2: 22 mmol/L (ref 22–32)
Calcium: 8.6 mg/dL — ABNORMAL LOW (ref 8.9–10.3)
Chloride: 109 mmol/L (ref 98–111)
Creatinine, Ser: 0.84 mg/dL (ref 0.44–1.00)
GFR, Estimated: >60 mL/min (ref >60)
Glucose, Bld: 108 mg/dL — ABNORMAL HIGH (ref 70–99)
Potassium: 3.6 mmol/L (ref 3.5–5.1)
Sodium: 137 mmol/L (ref 135–145)

## 2022-02-13 LAB — CBC
HCT: 38.1 % (ref 36.0–46.0)
Hemoglobin: 12.3 g/dL (ref 12.0–15.0)
MCH: 26.6 pg (ref 26.0–34.0)
MCHC: 32.3 g/dL (ref 30.0–36.0)
MCV: 82.5 fL (ref 80.0–100.0)
Platelets: 241 10*3/uL (ref 150–400)
RBC: 4.62 MIL/uL (ref 3.87–5.11)
RDW: 15.6 % — ABNORMAL HIGH (ref 11.5–15.5)
WBC: 7.6 10*3/uL (ref 4.0–10.5)
nRBC: 0 % (ref 0.0–0.2)

## 2022-02-13 LAB — APTT: aPTT: 26 seconds (ref 24–36)

## 2022-02-13 LAB — TROPONIN I (HIGH SENSITIVITY)
Troponin I (High Sensitivity): 284 ng/L (ref <18)
Troponin I (High Sensitivity): 303 ng/L (ref <18)
Troponin I (High Sensitivity): 309 ng/L (ref <18)
Troponin I (High Sensitivity): 276 ng/L (ref <18)

## 2022-02-13 LAB — PROTIME-INR
INR: 1 (ref 0.8–1.2)
Prothrombin Time: 13.5 seconds (ref 11.4–15.2)

## 2022-02-13 MED ORDER — METOPROLOL TARTRATE 25 MG PO TABS
25.0000 mg | ORAL_TABLET | Freq: Two times a day (BID) | ORAL | Status: DC
  Administered 2022-02-13 – 2022-02-15 (×4): 25 mg via ORAL
  Filled 2022-02-13 (×4): qty 1

## 2022-02-13 MED ORDER — ACETAMINOPHEN 325 MG PO TABS: 650.0000 mg | ORAL_TABLET | ORAL | Status: DC | PRN

## 2022-02-13 MED ORDER — IOHEXOL 350 MG/ML SOLN
75.0000 mL | Freq: Once | INTRAVENOUS | Status: AC | PRN
  Administered 2022-02-13: 75 mL via INTRAVENOUS

## 2022-02-13 MED ORDER — ASPIRIN 81 MG PO CHEW
324.0000 mg | CHEWABLE_TABLET | Freq: Once | ORAL | Status: AC
  Administered 2022-02-13: 324 mg via ORAL
  Filled 2022-02-13: qty 4

## 2022-02-13 MED ORDER — CELECOXIB 200 MG PO CAPS
200.0000 mg | ORAL_CAPSULE | Freq: Two times a day (BID) | ORAL | Status: DC
  Administered 2022-02-13 – 2022-02-15 (×3): 200 mg via ORAL
  Filled 2022-02-13 (×6): qty 1

## 2022-02-13 MED ORDER — LEVONORGESTREL-ETHINYL ESTRAD 0.15-30 MG-MCG PO TABS: 1.0000 | ORAL_TABLET | Freq: Every day | ORAL | Status: DC

## 2022-02-13 MED ORDER — ZOLPIDEM TARTRATE 5 MG PO TABS: 5.0000 mg | ORAL_TABLET | Freq: Every evening | ORAL | Status: DC | PRN

## 2022-02-13 MED ORDER — FLUTICASONE PROPIONATE 50 MCG/ACT NA SUSP
2.0000 | Freq: Every day | NASAL | Status: DC | PRN
Start: 2022-02-13 — End: 2022-02-15

## 2022-02-13 MED ORDER — AMPHETAMINE-DEXTROAMPHETAMINE 10 MG PO TABS
10.0000 mg | ORAL_TABLET | Freq: Two times a day (BID) | ORAL | Status: DC
  Administered 2022-02-15 (×2): 10 mg via ORAL
  Filled 2022-02-13 (×2): qty 1

## 2022-02-13 MED ORDER — PANTOPRAZOLE SODIUM 40 MG PO TBEC
40.0000 mg | DELAYED_RELEASE_TABLET | Freq: Every day | ORAL | Status: DC
  Administered 2022-02-14 – 2022-02-15 (×2): 40 mg via ORAL
  Filled 2022-02-13 (×2): qty 1

## 2022-02-13 MED ORDER — HEPARIN BOLUS VIA INFUSION
4000.0000 [IU] | Freq: Once | INTRAVENOUS | Status: AC
  Administered 2022-02-13: 4000 [IU] via INTRAVENOUS
  Filled 2022-02-13: qty 4000

## 2022-02-13 MED ORDER — HEPARIN (PORCINE) 25000 UT/250ML-% IV SOLN
1300.0000 [IU]/h | INTRAVENOUS | Status: DC
  Administered 2022-02-13: 1150 [IU]/h via INTRAVENOUS
  Administered 2022-02-14: 1300 [IU]/h via INTRAVENOUS
  Filled 2022-02-13 (×2): qty 250

## 2022-02-13 MED ORDER — ONDANSETRON HCL 4 MG/2ML IJ SOLN: 4.0000 mg | Freq: Four times a day (QID) | INTRAMUSCULAR | Status: DC | PRN

## 2022-02-13 MED ORDER — SODIUM CHLORIDE 0.45 % IV SOLN: INTRAVENOUS | Status: DC

## 2022-02-13 NOTE — Assessment & Plan Note (Addendum)
Advised patient that perhaps her Adderall may be causing some heightened sense of anxiety and palpitations and to monitor closely should chest pain recur.

## 2022-02-13 NOTE — Assessment & Plan Note (Addendum)
Presented to emergency room with chest pain.  Troponins minimally elevated from 2 80-300.  Initially placed on heparin.  EKG unremarkable.  Patient underwent cardiac cath by cardiology which was found to be normal.  Echocardiogram also normal without any evidence of systolic or diastolic dysfunction or valvular issue.

## 2022-02-13 NOTE — ED Triage Notes (Signed)
Pt via POV from home. Pt states she started having chest pain yesterday states it felt sharp and it was mid-sternal, and then radiated up her neck and into her jaw. PT states the pain is better today. Denies SOB. Denies cough. Denies cardiac hx. Pt is A&OX4 and NAD

## 2022-02-13 NOTE — Assessment & Plan Note (Addendum)
Initial blood pressure in the 140s however subsequent blood pressures stable.

## 2022-02-13 NOTE — Assessment & Plan Note (Signed)
Continue PPI ?

## 2022-02-13 NOTE — ED Notes (Signed)
Report received from [place], [place]

## 2022-02-13 NOTE — ED Notes (Signed)
Pt Urine Preg POC is Negative.

## 2022-02-13 NOTE — Telephone Encounter (Signed)
Aware, will watch for correspondence Agree with ER precautions

## 2022-02-14 ENCOUNTER — Encounter
Admission: EM | Disposition: A | Discharge status: Home / Self Care | Payer: Self-pay | Source: Home / Self Care | Attending: Emergency Medicine

## 2022-02-14 ENCOUNTER — Encounter: Payer: Self-pay | Admitting: Internal Medicine

## 2022-02-14 ENCOUNTER — Observation Stay: Admit: 2022-02-14 | Payer: 59

## 2022-02-14 DIAGNOSIS — I214 Non-ST elevation (NSTEMI) myocardial infarction: Secondary | ICD-10-CM | POA: Diagnosis not present

## 2022-02-14 DIAGNOSIS — R778 Other specified abnormalities of plasma proteins: Secondary | ICD-10-CM | POA: Diagnosis present

## 2022-02-14 DIAGNOSIS — R079 Chest pain, unspecified: Secondary | ICD-10-CM | POA: Diagnosis not present

## 2022-02-14 HISTORY — PX: LEFT HEART CATH AND CORONARY ANGIOGRAPHY: CATH118249

## 2022-02-14 LAB — LIPID PANEL
Cholesterol: 149 mg/dL (ref 0–200)
HDL: 35 mg/dL — ABNORMAL LOW (ref >40)
LDL Cholesterol: 84 mg/dL (ref 0–99)
Total CHOL/HDL Ratio: 4.3 RATIO
Triglycerides: 152 mg/dL — ABNORMAL HIGH (ref <150)
VLDL: 30 mg/dL (ref 0–40)

## 2022-02-14 LAB — CBC
HCT: 36.7 % (ref 36.0–46.0)
Hemoglobin: 11.9 g/dL — ABNORMAL LOW (ref 12.0–15.0)
MCH: 26.5 pg (ref 26.0–34.0)
MCHC: 32.4 g/dL (ref 30.0–36.0)
MCV: 81.7 fL (ref 80.0–100.0)
Platelets: 224 10*3/uL (ref 150–400)
RBC: 4.49 MIL/uL (ref 3.87–5.11)
RDW: 15.6 % — ABNORMAL HIGH (ref 11.5–15.5)
WBC: 6.5 10*3/uL (ref 4.0–10.5)
nRBC: 0 % (ref 0.0–0.2)

## 2022-02-14 LAB — HEPARIN LEVEL (UNFRACTIONATED)
Heparin Unfractionated: 0.25 IU/mL — ABNORMAL LOW (ref 0.30–0.70)
Heparin Unfractionated: 0.34 IU/mL (ref 0.30–0.70)

## 2022-02-14 LAB — HIV ANTIBODY (ROUTINE TESTING W REFLEX): HIV Screen 4th Generation wRfx: NONREACTIVE

## 2022-02-14 SURGERY — LEFT HEART CATH AND CORONARY ANGIOGRAPHY
Anesthesia: Moderate Sedation

## 2022-02-14 MED ORDER — SODIUM CHLORIDE 0.9 % IV SOLN: 250.0000 mL | INTRAVENOUS | Status: DC | PRN

## 2022-02-14 MED ORDER — HEPARIN (PORCINE) IN NACL 1000-0.9 UT/500ML-% IV SOLN
INTRAVENOUS | Status: AC
  Filled 2022-02-14: qty 1000

## 2022-02-14 MED ORDER — SODIUM CHLORIDE 0.9 % IV SOLN: INTRAVENOUS | Status: DC

## 2022-02-14 MED ORDER — HEPARIN SODIUM (PORCINE) 1000 UNIT/ML IJ SOLN
INTRAMUSCULAR | Status: DC | PRN
  Administered 2022-02-14: 5000 [IU] via INTRAVENOUS

## 2022-02-14 MED ORDER — MIDAZOLAM HCL 2 MG/2ML IJ SOLN
INTRAMUSCULAR | Status: AC
  Filled 2022-02-14: qty 2

## 2022-02-14 MED ORDER — LIDOCAINE HCL 1 % IJ SOLN
INTRAMUSCULAR | Status: AC
  Filled 2022-02-14: qty 20

## 2022-02-14 MED ORDER — VERAPAMIL HCL 2.5 MG/ML IV SOLN
INTRAVENOUS | Status: DC | PRN
  Administered 2022-02-14: 2.5 mg via INTRA_ARTERIAL

## 2022-02-14 MED ORDER — ASPIRIN 81 MG PO CHEW
CHEWABLE_TABLET | ORAL | Status: AC
  Administered 2022-02-14: 81 mg via ORAL
  Filled 2022-02-14: qty 1

## 2022-02-14 MED ORDER — SODIUM CHLORIDE 0.9% FLUSH
3.0000 mL | Freq: Two times a day (BID) | INTRAVENOUS | Status: DC
  Administered 2022-02-14 – 2022-02-15 (×2): 3 mL via INTRAVENOUS

## 2022-02-14 MED ORDER — HEPARIN SODIUM (PORCINE) 1000 UNIT/ML IJ SOLN
INTRAMUSCULAR | Status: AC
  Filled 2022-02-14: qty 10

## 2022-02-14 MED ORDER — LABETALOL HCL 5 MG/ML IV SOLN: 10.0000 mg | INTRAVENOUS | Status: AC | PRN

## 2022-02-14 MED ORDER — MIDAZOLAM HCL 2 MG/2ML IJ SOLN
INTRAMUSCULAR | Status: DC | PRN
  Administered 2022-02-14 (×2): 1 mg via INTRAVENOUS

## 2022-02-14 MED ORDER — HYDRALAZINE HCL 20 MG/ML IJ SOLN: 10.0000 mg | INTRAMUSCULAR | Status: AC | PRN

## 2022-02-14 MED ORDER — SODIUM CHLORIDE 0.9% FLUSH: 3.0000 mL | INTRAVENOUS | Status: DC | PRN

## 2022-02-14 MED ORDER — SODIUM CHLORIDE 0.9 % IV SOLN: INTRAVENOUS | Status: AC

## 2022-02-14 MED ORDER — SODIUM CHLORIDE 0.9% FLUSH: 3.0000 mL | Freq: Two times a day (BID) | INTRAVENOUS | Status: DC

## 2022-02-14 MED ORDER — LIDOCAINE HCL (PF) 1 % IJ SOLN
INTRAMUSCULAR | Status: DC | PRN
  Administered 2022-02-14: 2 mL

## 2022-02-14 MED ORDER — ENOXAPARIN SODIUM 40 MG/0.4ML IJ SOSY
40.0000 mg | PREFILLED_SYRINGE | INTRAMUSCULAR | Status: DC
  Administered 2022-02-15: 40 mg via SUBCUTANEOUS
  Filled 2022-02-14 (×2): qty 0.4

## 2022-02-14 MED ORDER — HEPARIN BOLUS VIA INFUSION
1300.0000 [IU] | Freq: Once | INTRAVENOUS | Status: AC
  Administered 2022-02-14: 1300 [IU] via INTRAVENOUS
  Filled 2022-02-14: qty 1300

## 2022-02-14 MED ORDER — IOHEXOL 300 MG/ML  SOLN
INTRAMUSCULAR | Status: DC | PRN
  Administered 2022-02-14: 53 mL

## 2022-02-14 MED ORDER — VERAPAMIL HCL 2.5 MG/ML IV SOLN
INTRAVENOUS | Status: AC
  Filled 2022-02-14: qty 2

## 2022-02-14 MED ORDER — ASPIRIN 81 MG PO CHEW: 81.0000 mg | CHEWABLE_TABLET | ORAL | Status: AC

## 2022-02-14 MED ORDER — FENTANYL CITRATE (PF) 100 MCG/2ML IJ SOLN
INTRAMUSCULAR | Status: DC | PRN
  Administered 2022-02-14 (×2): 25 ug via INTRAVENOUS

## 2022-02-14 MED ORDER — FENTANYL CITRATE (PF) 100 MCG/2ML IJ SOLN
INTRAMUSCULAR | Status: AC
  Filled 2022-02-14: qty 2

## 2022-02-14 SURGICAL SUPPLY — 11 items
BAND CMPR LRG ZPHR (HEMOSTASIS) ×1
BAND ZEPHYR COMPRESS 30 LONG (HEMOSTASIS) ×1 IMPLANT
CATH 5FR JL3.5 JR4 ANG PIG MP (CATHETERS) ×1 IMPLANT
DRAPE BRACHIAL (DRAPES) ×1 IMPLANT
GLIDESHEATH SLEND SS 6F .021 (SHEATH) ×1 IMPLANT
GUIDEWIRE INQWIRE 1.5J.035X260 (WIRE) IMPLANT
INQWIRE 1.5J .035X260CM (WIRE) ×2
PACK CARDIAC CATH (CUSTOM PROCEDURE TRAY) ×2 IMPLANT
PROTECTION STATION PRESSURIZED (MISCELLANEOUS) ×2
SET ATX SIMPLICITY (MISCELLANEOUS) ×1 IMPLANT
STATION PROTECTION PRESSURIZED (MISCELLANEOUS) IMPLANT

## 2022-02-14 NOTE — ED Notes (Signed)
Pt is sleeping, chest rise and fall observed. Heparin infusing without incidence. Sinus brady on the monitor. No needs at this time.

## 2022-02-14 NOTE — Consult Note (Signed)
Cardiology Consultation:   Patient ID: [name] MRN: [id number]; DOB: [birth year]  Admit date: 02/13/2022 Date of Consult: 02/14/2022  PCP:  Judy Pimple, MD   [facility] Providers Cardiologist:  Yvonne Kendall, MD        Patient Profile:   [name] is a 41 y.o. female with a hx of ADD, GERD, elevated blood pressure without dx of HTN, elevated TSH, insomnia, migraines who is being seen 02/14/2022 for the evaluation of chest pain at the request of Dr. Derrill Kay.  History of Present Illness:   [name] is a 41 year old female with a past medical history of ADD, GERD, elevated blood pressure without ever being diagnosed  with HTN, elevated TSH, insomnia, migraines who presented to the emergency department at the recommendation made by urgent care for chest pain. She stated the pain started on Sunday morning as a pressure in the center of her chest that radiated into her neck and jaw. The pain eased throughout the day but she continued to have fatigue and just did not feel well. She experienced associated symptoms of dyspnea and palpitations.She had not experienced an episode like this previously and took it easy the rest of the day. On Monday she did go on into work and continued to just not feel well and decided to go to Urgent Care. Urgent care recommendation was that she be evaluated in the emergency department. She arrived to [facility] emergency department for further work-up.   Initial vital signs:blood pressure 149/84, pulse 80, resp rate 16, temp 98.5  Medications: heparin drip  Labs:glucose 108, calcium 8.6, Hs Trops 284, 303, 309, 276, urine preg was negative   Imaging: CTA chest negative for PE, CXR WNL   Past Medical History:  Diagnosis Date   ADD (attention deficit disorder)    Elevated BP without diagnosis of hypertension    Elevated TSH    GERD (gastroesophageal reflux disease)    Insomnia    Migraines     History reviewed. No pertinent surgical history.    Home Medications:  Prior to Admission medications   Medication Sig Start Date End Date Taking? Authorizing Provider  amphetamine-dextroamphetamine (ADDERALL) 10 MG tablet Take one pill by mouth every am and one at lunchtime 01/20/22  Yes Tower, Idamae Schuller A, MD  celecoxib (CELEBREX) 200 MG capsule TAKE 1 CAPSULE BY MOUTH TWICE A DAY 02/08/22  Yes Hyatt, Max T, DPM  fluticasone (FLONASE) 50 MCG/ACT nasal spray Place 2 sprays into both nostrils daily. 10/18/21  Yes Eden Emms, NP  levonorgestrel-ethinyl estradiol Erenest Rasher) 0.15-30 MG-MCG tablet Take 1 tablet by mouth daily. 06/07/21  Yes Tower, Audrie Gallus, MD  omeprazole (PRILOSEC) 20 MG capsule Take 1 capsule (20 mg total) by mouth daily. 06/07/21  Yes Tower, Audrie Gallus, MD  zolpidem (AMBIEN) 10 MG tablet TAKE 1 TABLET BY MOUTH AT BEDTIME AS NEEDED FOR SLEEP 12/27/21  Yes Tower, Audrie Gallus, MD    Inpatient Medications: Scheduled Meds:  amphetamine-dextroamphetamine  10 mg Oral BID WC   celecoxib  200 mg Oral BID   metoprolol tartrate  25 mg Oral BID   pantoprazole  40 mg Oral Daily   Continuous Infusions:  sodium chloride 50 mL/hr at 02/14/22 0526   heparin 1,300 Units/hr (02/14/22 0114)   PRN Meds: acetaminophen, fluticasone, ondansetron (ZOFRAN) IV, zolpidem  Allergies:   No Known Allergies  Social History:   Social History   Socioeconomic History   Marital status: Married    Spouse name: Not on  file   Number of children: Not on file   Years of education: Not on file   Highest education level: Not on file  Occupational History   Not on file  Tobacco Use   Smoking status: Never   Smokeless tobacco: Never  Substance and Sexual Activity   Alcohol use: Yes    Alcohol/week: 0.0 standard drinks of alcohol    Comment: occasional    Drug use: No   Sexual activity: Not on file  Other Topics Concern   Not on file  Social History Narrative   Not on file   Social Determinants of Health   Financial Resource Strain: Not on file  Food  Insecurity: Not on file  Transportation Needs: Not on file  Physical Activity: Not on file  Stress: Not on file  Social Connections: Not on file  Intimate Partner Violence: Not on file    Family History:    Family History  Problem Relation Age of Onset   Healthy Mother    Cancer Father      ROS:  Please see the history of present illness.  Review of Systems  Constitutional:  Positive for malaise/fatigue.  HENT: Negative.    Eyes: Negative.   Respiratory:  Positive for shortness of breath.   Cardiovascular:  Positive for chest pain and palpitations.  Gastrointestinal:  Positive for heartburn.  Genitourinary: Negative.   Musculoskeletal: Negative.   Skin: Negative.   Neurological:  Positive for weakness.  Endo/Heme/Allergies: Negative.   Psychiatric/Behavioral: Negative.      All other ROS reviewed and negative.     Physical Exam/Data:   Vitals:   02/14/22 0615 02/14/22 0630 02/14/22 0645 02/14/22 0705  BP:    124/62  Pulse: 69 (!) 58 (!) 57 61  Resp: (!) 28 16 14 12   Temp:      TempSrc:      SpO2: 96% 99% 99% 97%  Weight:      Height:       No intake or output data in the 24 hours ending 02/14/22 1000    02/13/2022    2:27 PM 10/18/2021    8:57 AM 06/07/2021    9:45 AM  Last 3 Weights  Weight (lbs) 260 lb 259 lb 1 oz 263 lb 4 oz  Weight (kg) 117.935 kg 117.51 kg 119.409 kg     Body mass index is 40.72 kg/m.  General:  Well nourished, well developed, in no acute distress HEENT: normal, glasses on Neck: no JVD Vascular: No carotid bruits; Distal pulses 2+ bilaterally Cardiac:  normal S1, S2; RRR; no murmur  Lungs:  clear to auscultation bilaterally, no wheezing, rhonchi or rales, respirations are unlabored on room air  Abd: soft, nontender, no hepatomegaly  Ext: no edema Musculoskeletal:  No deformities, BUE and BLE strength normal and equal Skin: warm and dry  Neuro:  CNs 2-12 intact, no focal abnormalities noted Psych:  Normal affect   EKG:  The  EKG was personally reviewed and demonstrates:  SR and SA rate of 87 Telemetry:  Telemetry was personally reviewed and demonstrates:  SB to SR rates 58-64  Relevant CV Studies: Echocardiogram pending  Laboratory Data:  High Sensitivity Troponin:   Recent Labs  Lab 02/13/22 1428 02/13/22 1630 02/13/22 2033 02/13/22 2228  TROPONINIHS 284* 303* 309* 276*     Chemistry Recent Labs  Lab 02/13/22 1428  NA 137  K 3.6  CL 109  CO2 22  GLUCOSE 108*  BUN 7  CREATININE 0.84  CALCIUM 8.6*  GFRNONAA >60  ANIONGAP 6    No results for input(s): "PROT", "ALBUMIN", "AST", "ALT", "ALKPHOS", "BILITOT" in the last 168 hours. Lipids  Recent Labs  Lab 02/14/22 0656  CHOL 149  TRIG 152*  HDL 35*  LDLCALC 84  CHOLHDL 4.3    Hematology Recent Labs  Lab 02/13/22 1428 02/14/22 0656  WBC 7.6 6.5  RBC 4.62 4.49  HGB 12.3 11.9*  HCT 38.1 36.7  MCV 82.5 81.7  MCH 26.6 26.5  MCHC 32.3 32.4  RDW 15.6* 15.6*  PLT 241 224   Thyroid No results for input(s): "TSH", "FREET4" in the last 168 hours.  BNPNo results for input(s): "BNP", "PROBNP" in the last 168 hours.  DDimer No results for input(s): "DDIMER" in the last 168 hours.   Radiology/Studies:  CT Angio Chest PE W and/or Wo Contrast  Result Date: 02/13/2022 CLINICAL DATA:  Chest pain starting yesterday. EXAM: CT ANGIOGRAPHY CHEST WITH CONTRAST TECHNIQUE: Multidetector CT imaging of the chest was performed using the standard protocol during bolus administration of intravenous contrast. Multiplanar CT image reconstructions and MIPs were obtained to evaluate the vascular anatomy. RADIATION DOSE REDUCTION: This exam was performed according to the departmental dose-optimization program which includes automated exposure control, adjustment of the mA and/or kV according to patient size and/or use of iterative reconstruction technique. CONTRAST:  75mL OMNIPAQUE IOHEXOL 350 MG/ML SOLN COMPARISON:  None Available. FINDINGS: Cardiovascular:  Satisfactory opacification of the pulmonary arteries to the segmental level. No evidence of pulmonary embolism. Normal heart size. No pericardial effusion. Mediastinum/Nodes: No enlarged mediastinal, hilar, or axillary lymph nodes. Thyroid gland, trachea, and esophagus demonstrate no significant findings. Lungs/Pleura: Lungs are clear. No pleural effusion or pneumothorax. Upper Abdomen: No acute abnormality. Musculoskeletal: No chest wall abnormality. No acute or significant osseous findings. Review of the MIP images confirms the above findings. IMPRESSION: No acute pulmonary embolism or other acute intrathoracic process. Electronically Signed   By: Emmaline Kluver M.D.   On: 02/13/2022 17:46   DG Chest 2 View  Result Date: 02/13/2022 CLINICAL DATA:  Onset of sharp midsternal chest pain yesterday radiating to neck and jaw, better today EXAM: CHEST - 2 VIEW COMPARISON:  None FINDINGS: Normal heart size, mediastinal contours, and pulmonary vascularity. Lungs clear. No pleural effusion or pneumothorax. Bones unremarkable. IMPRESSION: Normal exam. Electronically Signed   By: Ulyses Southward M.D.   On: 02/13/2022 14:49     Assessment and Plan:   Chest pain        Elevated Hs Troponins        NSTEMI -trended Hs troponins 284, 303, 309, 276 -EKG PRN pain or changes -continue heparin drip -patient scheduled for LHC later today -keep NPO -currently pain free -CTA chest negative for PE -continue metoprolol 25 mg bid -further medications changes to be made if needed after procedure   2.GERD -continue PTA medication  3. Elevated blood pressure  -vital per unit protocol -continue bb therapy -blood pressure 124/62  4. ADD -continue PTA medications  Risk Assessment/Risk Scores:      For questions or updates, please contact [facility] Please consult [email or web address] for contact info under    Signed, Jacaden Forbush, NP  02/14/2022 10:00 AM

## 2022-02-14 NOTE — Progress Notes (Signed)
ANTICOAGULATION CONSULT NOTE   Pharmacy Consult for Heparin drip Indication: chest pain/ACS  No Known Allergies  Patient Measurements: Height: 5\' 7"  (170.2 cm) Weight: 117.9 kg (260 lb) IBW/kg (Calculated) : 61.6 Heparin Dosing Weight: 89.3 kg  Vital Signs: Temp: 97.6 F (36.4 C) (06/27 1218) Temp Source: Oral (06/27 1218) BP: 132/74 (06/27 1218) Pulse Rate: 69 (06/27 1218)  Labs: Recent Labs    02/13/22 1428 02/13/22 1429 02/13/22 1630 02/13/22 2033 02/13/22 2228 02/14/22 0002 02/14/22 0656 02/14/22 0857  HGB 12.3  --   --   --   --   --  11.9*  --   HCT 38.1  --   --   --   --   --  36.7  --   PLT 241  --   --   --   --   --  224  --   APTT  --  26  --   --   --   --   --   --   LABPROT  --  13.5  --   --   --   --   --   --   INR  --  1.0  --   --   --   --   --   --   HEPARINUNFRC  --   --   --   --   --  0.25*  --  0.34  CREATININE 0.84  --   --   --   --   --   --   --   TROPONINIHS 284*  --  303* 309* 276*  --   --   --      Estimated Creatinine Clearance: 117 mL/min (by C-G formula based on SCr of 0.84 mg/dL).   Medical History: Past Medical History:  Diagnosis Date   ADD (attention deficit disorder)    Elevated BP without diagnosis of hypertension    Elevated TSH    GERD (gastroesophageal reflux disease)    Insomnia    Migraines     Medications:  Scheduled:   [MAR Hold] amphetamine-dextroamphetamine  10 mg Oral BID WC   [MAR Hold] celecoxib  200 mg Oral BID   [MAR Hold] metoprolol tartrate  25 mg Oral BID   [MAR Hold] pantoprazole  40 mg Oral Daily   sodium chloride flush  3 mL Intravenous Q12H   Infusions:   sodium chloride 50 mL/hr at 02/14/22 0526   sodium chloride     sodium chloride 100 mL/hr at 02/14/22 1221   heparin Stopped (02/14/22 1229)    Assessment: 41 yo F w/ CP to start heparin drip  No anticoagulation PTA per Med Rec Hgb 12.3  plt 241  aPTT 26  INR 1.0   Goal of Therapy:  Heparin level 0.3-0.7 units/ml Monitor  platelets by anticoagulation protocol: Yes  6/27 0857 HL 0.34, within range   Plan:  Heparin at therapeutic range Continue heparin infusion 100 unit/mL @ 13 mL/hr Recheck HL in 6 hrs CBC daily while on heparin  Trilby Drummer, PharmD Student 02/14/2022 1:05 PM

## 2022-02-15 ENCOUNTER — Inpatient Hospital Stay (HOSPITAL_COMMUNITY)
Admit: 2022-02-15 | Discharge: 2022-02-15 | Disposition: A | Discharge status: Home / Self Care | Payer: 59 | Attending: Internal Medicine | Admitting: Internal Medicine

## 2022-02-15 ENCOUNTER — Encounter: Payer: Self-pay | Admitting: Internal Medicine

## 2022-02-15 DIAGNOSIS — R030 Elevated blood-pressure reading, without diagnosis of hypertension: Secondary | ICD-10-CM | POA: Diagnosis not present

## 2022-02-15 DIAGNOSIS — R072 Precordial pain: Secondary | ICD-10-CM

## 2022-02-15 DIAGNOSIS — R079 Chest pain, unspecified: Secondary | ICD-10-CM

## 2022-02-15 DIAGNOSIS — R0789 Other chest pain: Secondary | ICD-10-CM

## 2022-02-15 DIAGNOSIS — K2100 Gastro-esophageal reflux disease with esophagitis, without bleeding: Secondary | ICD-10-CM | POA: Diagnosis not present

## 2022-02-15 DIAGNOSIS — R778 Other specified abnormalities of plasma proteins: Secondary | ICD-10-CM | POA: Diagnosis not present

## 2022-02-15 DIAGNOSIS — F988 Other specified behavioral and emotional disorders with onset usually occurring in childhood and adolescence: Secondary | ICD-10-CM | POA: Diagnosis not present

## 2022-02-15 LAB — BASIC METABOLIC PANEL
Anion gap: 8 (ref 5–15)
BUN: 9 mg/dL (ref 6–20)
CO2: 20 mmol/L — ABNORMAL LOW (ref 22–32)
Calcium: 8.9 mg/dL (ref 8.9–10.3)
Chloride: 112 mmol/L — ABNORMAL HIGH (ref 98–111)
Creatinine, Ser: 0.95 mg/dL (ref 0.44–1.00)
GFR, Estimated: >60 mL/min (ref >60)
Glucose, Bld: 85 mg/dL (ref 70–99)
Potassium: 4.1 mmol/L (ref 3.5–5.1)
Sodium: 140 mmol/L (ref 135–145)

## 2022-02-15 LAB — ECHOCARDIOGRAM COMPLETE
AR max vel: 3.21 cm2
AV Area VTI: 3.25 cm2
AV Area mean vel: 3.16 cm2
AV Mean grad: 3 mmHg
AV Peak grad: 6 mmHg
Ao pk vel: 1.22 m/s
Area-P 1/2: 3.21 cm2
Height: 67 in
S' Lateral: 3.37 cm
Weight: 4160 oz

## 2022-02-15 LAB — CBC
HCT: 39.1 % (ref 36.0–46.0)
Hemoglobin: 12.7 g/dL (ref 12.0–15.0)
MCH: 26.3 pg (ref 26.0–34.0)
MCHC: 32.5 g/dL (ref 30.0–36.0)
MCV: 81 fL (ref 80.0–100.0)
Platelets: 269 10*3/uL (ref 150–400)
RBC: 4.83 MIL/uL (ref 3.87–5.11)
RDW: 15.5 % (ref 11.5–15.5)
WBC: 8.3 10*3/uL (ref 4.0–10.5)
nRBC: 0 % (ref 0.0–0.2)

## 2022-02-15 NOTE — TOC Transition Note (Signed)
Transition of Care [facility]) - CM/SW Discharge Note   Patient Details  Name: [name] MRN: [id number] Date of Birth: 1981-07-02  [facility] of Care [facility]) CM/SW Contact:  Truddie Hidden, RN Phone Number: 02/15/2022, 12:55 PM   Clinical Narrative:     Transition of Care [facility]) Screening Note   Patient Details  Name: [name] Date of Birth: [birth year]   Transition of Care [facility]) CM/SW Contact:    Truddie Hidden, RN Phone Number: 02/15/2022, 12:55 PM    Transition of Care Department [facility]) has reviewed patient and no TOC needs have been identified at this time. We will continue to monitor patient advancement through interdisciplinary progression rounds. If new patient transition needs arise, please place a TOC consult.           Patient Goals and CMS Choice        Discharge Placement                       Discharge Plan and Services                                     Social Determinants of Health (SDOH) Interventions     Readmission Risk Interventions     No data to display

## 2022-02-15 NOTE — Progress Notes (Signed)
*  PRELIMINARY RESULTS* Echocardiogram 2D Echocardiogram has been performed.  [name] 02/15/2022, 8:57 AM

## 2022-02-15 NOTE — Hospital Course (Signed)
41 year old female with past medical history of morbid obesity, elevated TSH who presented to the emergency room on 6/26 with complaints of chest pain with elevated troponins which peaked at 309.  Patient underwent a cardiac catheter did not show any significant CAD.  CT angiogram of chest ruled out pulmonary embolism.  Cardiology following.  Echocardiogram done 6/28 was normal.  Chest pain had resolved and felt to be noncardiac, possibly esophageal spasm versus stress/anxiety.  Should this recur, patient will follow-up with her PCP for further evaluation.

## 2022-02-15 NOTE — Progress Notes (Signed)
Progress Note  Patient Name: [name] Date of Encounter: 02/15/2022  [facility] Cardiologist: Yvonne Kendall, MD   Subjective   Denies chest pain or shortness of breath, currently getting echocardiogram.  No acute events overnight.  Left heart cath performed yesterday with no CAD.  Inpatient Medications    Scheduled Meds:  amphetamine-dextroamphetamine  10 mg Oral BID WC   celecoxib  200 mg Oral BID   enoxaparin (LOVENOX) injection  40 mg Subcutaneous Q24H   metoprolol tartrate  25 mg Oral BID   pantoprazole  40 mg Oral Daily   sodium chloride flush  3 mL Intravenous Q12H   Continuous Infusions:  sodium chloride     PRN Meds: sodium chloride, acetaminophen, fluticasone, ondansetron (ZOFRAN) IV, sodium chloride flush, zolpidem   Vital Signs    Vitals:   02/14/22 2024 02/14/22 2334 02/15/22 0424 02/15/22 0732  BP: 114/63 108/66 124/69 112/87  Pulse: 67 63 64 70  Resp: 18  (!) 22 17  Temp: (!) 97.4 F (36.3 C) 98.3 F (36.8 C) 98.5 F (36.9 C)   TempSrc: Oral Oral Oral   SpO2: 99% 100% 100% 98%  Weight:      Height:        Intake/Output Summary (Last 24 hours) at 02/15/2022 1029 Last data filed at 02/14/2022 1705 Gross per 24 hour  Intake 1195.8 ml  Output --  Net 1195.8 ml      02/13/2022    2:27 PM 10/18/2021    8:57 AM 06/07/2021    9:45 AM  Last 3 Weights  Weight (lbs) 260 lb 259 lb 1 oz 263 lb 4 oz  Weight (kg) 117.935 kg 117.51 kg 119.409 kg      Telemetry    Sinus rhythm- Personally Reviewed  ECG     - Personally Reviewed  Physical Exam   GEN: No acute distress.   Neck: No JVD Cardiac: RRR, no murmurs, rubs, or gallops.  Respiratory: Clear to auscultation bilaterally. GI: Soft, nontender, non-distended  MS: No edema; No deformity. Neuro:  Nonfocal  Psych: Normal affect   Labs    High Sensitivity Troponin:   Recent Labs  Lab 02/13/22 1428 02/13/22 1630 02/13/22 2033 02/13/22 2228  TROPONINIHS 284* 303* 309* 276*      Chemistry Recent Labs  Lab 02/13/22 1428 02/15/22 0440  NA 137 140  K 3.6 4.1  CL 109 112*  CO2 22 20*  GLUCOSE 108* 85  BUN 7 9  CREATININE 0.84 0.95  CALCIUM 8.6* 8.9  GFRNONAA >60 >60  ANIONGAP 6 8    Lipids  Recent Labs  Lab 02/14/22 0656  CHOL 149  TRIG 152*  HDL 35*  LDLCALC 84  CHOLHDL 4.3    Hematology Recent Labs  Lab 02/13/22 1428 02/14/22 0656 02/15/22 0440  WBC 7.6 6.5 8.3  RBC 4.62 4.49 4.83  HGB 12.3 11.9* 12.7  HCT 38.1 36.7 39.1  MCV 82.5 81.7 81.0  MCH 26.6 26.5 26.3  MCHC 32.3 32.4 32.5  RDW 15.6* 15.6* 15.5  PLT 241 224 269   Thyroid No results for input(s): "TSH", "FREET4" in the last 168 hours.  BNPNo results for input(s): "BNP", "PROBNP" in the last 168 hours.  DDimer No results for input(s): "DDIMER" in the last 168 hours.   Radiology    ECHOCARDIOGRAM COMPLETE  Result Date: 02/15/2022    ECHOCARDIOGRAM REPORT   Patient Name:   [facility] Date of Exam: 02/15/2022 Medical Rec #:  [id number]  Height:       67.0 in Accession #:    [id number]    Weight:       260.0 lb Date of Birth:  1980-10-04     BSA:          2.261 m Patient Age:    40 years      BP:           112/87 mmHg Patient Gender: F             HR:           61 bpm. Exam Location:  [facility] Procedure: 2D Echo, Color Doppler and Cardiac Doppler Indications:     R07.9 Chest Pain  History:         Patient has no prior history of Echocardiogram examinations.                  NSTEMI.  Sonographer:     Humphrey Rolls Referring Phys:  [phone] CHRISTOPHER END Diagnosing Phys: Debbe Odea MD  Sonographer Comments: Suboptimal apical window and no subcostal window. IMPRESSIONS  1. Left ventricular ejection fraction, by estimation, is 60 to 65%. The left ventricle has normal function. The left ventricle has no regional wall motion abnormalities. Left ventricular diastolic parameters were normal.  2. Right ventricular systolic function is normal. The right ventricular size is not well visualized.  3.  The mitral valve is normal in structure. No evidence of mitral valve regurgitation.  4. The aortic valve is grossly normal. Aortic valve regurgitation is not visualized.  5. The inferior vena cava is normal in size with greater than 50% respiratory variability, suggesting right atrial pressure of 3 mmHg. FINDINGS  Left Ventricle: Left ventricular ejection fraction, by estimation, is 60 to 65%. The left ventricle has normal function. The left ventricle has no regional wall motion abnormalities. The left ventricular internal cavity size was normal in size. There is  no left ventricular hypertrophy. Left ventricular diastolic parameters were normal. Right Ventricle: The right ventricular size is not well visualized. No increase in right ventricular wall thickness. Right ventricular systolic function is normal. Left Atrium: Left atrial size was normal in size. Right Atrium: Right atrial size was normal in size. Pericardium: There is no evidence of pericardial effusion. Mitral Valve: The mitral valve is normal in structure. No evidence of mitral valve regurgitation. Tricuspid Valve: The tricuspid valve is normal in structure. Tricuspid valve regurgitation is trivial. Aortic Valve: The aortic valve is grossly normal. Aortic valve regurgitation is not visualized. Aortic valve mean gradient measures 3.0 mmHg. Aortic valve peak gradient measures 6.0 mmHg. Aortic valve area, by VTI measures 3.25 cm. Pulmonic Valve: The pulmonic valve was not well visualized. Pulmonic valve regurgitation is not visualized. Aorta: The aortic root and ascending aorta are structurally normal, with no evidence of dilitation. Venous: The inferior vena cava is normal in size with greater than 50% respiratory variability, suggesting right atrial pressure of 3 mmHg. IAS/Shunts: No atrial level shunt detected by color flow Doppler.  LEFT VENTRICLE PLAX 2D LVIDd:         4.71 cm   Diastology LVIDs:         3.37 cm   LV e' medial:    8.16 cm/s LV PW:          1.03 cm   LV E/e' medial:  9.8 LV IVS:        0.82 cm   LV e' lateral:   12.00 cm/s LVOT diam:  2.10 cm   LV E/e' lateral: 6.6 LV SV:         88 LV SV Index:   39 LVOT Area:     3.46 cm  RIGHT VENTRICLE RV Basal diam:  4.11 cm TAPSE (M-mode): 2.4 cm LEFT ATRIUM             Index        RIGHT ATRIUM           Index LA diam:        3.20 cm 1.42 cm/m   RA Area:     14.70 cm LA Vol (A2C):   25.0 ml 11.06 ml/m  RA Volume:   38.40 ml  16.99 ml/m LA Vol (A4C):   30.2 ml 13.36 ml/m LA Biplane Vol: 30.2 ml 13.36 ml/m  AORTIC VALVE AV Area (Vmax):    3.21 cm AV Area (Vmean):   3.16 cm AV Area (VTI):     3.25 cm AV Vmax:           122.00 cm/s AV Vmean:          85.000 cm/s AV VTI:            0.270 m AV Peak Grad:      6.0 mmHg AV Mean Grad:      3.0 mmHg LVOT Vmax:         113.00 cm/s LVOT Vmean:        77.500 cm/s LVOT VTI:          0.253 m LVOT/AV VTI ratio: 0.94  AORTA Ao Root diam: 2.90 cm MITRAL VALVE MV Area (PHT): 3.21 cm    SHUNTS MV Decel Time: 236 msec    Systemic VTI:  0.25 m MV E velocity: 79.70 cm/s  Systemic Diam: 2.10 cm MV A velocity: 60.40 cm/s MV E/A ratio:  1.32 Debbe Odea MD Electronically signed by Debbe Odea MD Signature Date/Time: 02/15/2022/10:28:28 AM    Final    CARDIAC CATHETERIZATION  Result Date: 02/14/2022 Conclusions: No angiographically significant coronary artery disease.  Consider alternative causes of chest pain and elevated troponin such as microvascular disease, myopericarditis, or vasospasm. Normal left ventricular systolic function with mildly elevated filling pressure. Recommendations: Discontinue IV heparin given no significant coronary artery disease. Follow-up echocardiogram. Defer cardiac rehab referral, as this does not represent a type 1 MI. Yvonne Kendall, MD [facility]  CT Angio Chest PE W and/or Wo Contrast  Result Date: 02/13/2022 CLINICAL DATA:  Chest pain starting yesterday. EXAM: CT ANGIOGRAPHY CHEST WITH CONTRAST TECHNIQUE:  Multidetector CT imaging of the chest was performed using the standard protocol during bolus administration of intravenous contrast. Multiplanar CT image reconstructions and MIPs were obtained to evaluate the vascular anatomy. RADIATION DOSE REDUCTION: This exam was performed according to the departmental dose-optimization program which includes automated exposure control, adjustment of the mA and/or kV according to patient size and/or use of iterative reconstruction technique. CONTRAST:  54mL OMNIPAQUE IOHEXOL 350 MG/ML SOLN COMPARISON:  None Available. FINDINGS: Cardiovascular: Satisfactory opacification of the pulmonary arteries to the segmental level. No evidence of pulmonary embolism. Normal heart size. No pericardial effusion. Mediastinum/Nodes: No enlarged mediastinal, hilar, or axillary lymph nodes. Thyroid gland, trachea, and esophagus demonstrate no significant findings. Lungs/Pleura: Lungs are clear. No pleural effusion or pneumothorax. Upper Abdomen: No acute abnormality. Musculoskeletal: No chest wall abnormality. No acute or significant osseous findings. Review of the MIP images confirms the above findings. IMPRESSION: No acute pulmonary embolism or other acute intrathoracic process. Electronically  Signed   By: Emmaline Kluver M.D.   On: 02/13/2022 17:46   DG Chest 2 View  Result Date: 02/13/2022 CLINICAL DATA:  Onset of sharp midsternal chest pain yesterday radiating to neck and jaw, better today EXAM: CHEST - 2 VIEW COMPARISON:  None FINDINGS: Normal heart size, mediastinal contours, and pulmonary vascularity. Lungs clear. No pleural effusion or pneumothorax. Bones unremarkable. IMPRESSION: Normal exam. Electronically Signed   By: Ulyses Southward M.D.   On: 02/13/2022 14:49    Cardiac Studies   TTE 02/15/2022  1. Left ventricular ejection fraction, by estimation, is 60 to 65%. The  left ventricle has normal function. The left ventricle has no regional  wall motion abnormalities. Left  ventricular diastolic parameters were  normal.   2. Right ventricular systolic function is normal. The right ventricular  size is not well visualized.   3. The mitral valve is normal in structure. No evidence of mitral valve  regurgitation.   4. The aortic valve is grossly normal. Aortic valve regurgitation is not  visualized.   5. The inferior vena cava is normal in size with greater than 50%  respiratory variability, suggesting right atrial pressure of 3 mmHg.   LHC 02/14/2022 Conclusions: No angiographically significant coronary artery disease.  Consider alternative causes of chest pain and elevated troponin such as microvascular disease, myopericarditis, or vasospasm. Normal left ventricular systolic function with mildly elevated filling pressure.   Recommendations: Discontinue IV heparin given no significant coronary artery disease. Follow-up echocardiogram. Defer cardiac rehab referral, as this does not represent a type 1 MI.    Patient Profile     41 y.o. female with history of GERD, presenting with chest pain, found to have elevated troponins, diagnosed with NSTEMI.  Underwent left heart cath showing no coronary artery disease.  Assessment & Plan    Chest pain, elevated troponin -Left heart cath with no CAD -Echo today was normal, EF 60 to 65% -Consider normal cardiac causes for chest pain including esophageal spasm, GERD -No additional cardiac testing planned or indicated  2.  History of GERD -Continue PTA Protonix  No additional cardiac testing planned, patient can be discharged from a cardiac perspective.  Close follow-up as outpatient advised.  Please let us know if additional input is needed.  Total encounter time more than 50 minutes  Greater than 50% was spent in counseling and coordination of care with the patient     Signed, Debbe Odea, MD  02/15/2022, 10:29 AM

## 2022-02-16 LAB — LIPOPROTEIN A (LPA): Lipoprotein (a): <8.4 nmol/L (ref <75.0)

## 2022-02-16 NOTE — Assessment & Plan Note (Signed)
As above.  Not felt to be ACS.

## 2022-02-16 NOTE — Assessment & Plan Note (Signed)
Meets criteria with BMI greater than 40 

## 2022-02-16 NOTE — Discharge Summary (Signed)
Physician Discharge Summary   Patient: [name] MRN: [id number] DOB: 04/19/1981  Admit date:     02/13/2022  Discharge date: 02/15/2022  Discharge Physician: Hollice Espy   PCP: Judy Pimple, MD   Recommendations at discharge:   Patient will follow-up with her PCP as scheduled in the next few weeks.  Discharge Diagnoses: Active Problems:   ADD (attention deficit disorder)   Elevated troponin   GERD (gastroesophageal reflux disease)   Obesity, Class III, BMI 40-49.9 (morbid obesity) (HCC)  Principal Problem (Resolved):   Chest pain Resolved Problems:   Elevated BP without diagnosis of hypertension  Hospital Course: 41 year old female with past medical history of morbid obesity, elevated TSH who presented to the emergency room on 6/26 with complaints of chest pain with elevated troponins which peaked at 309.  Patient underwent a cardiac catheter did not show any significant CAD.  CT angiogram of chest ruled out pulmonary embolism.  Cardiology following.  Echocardiogram done 6/28 was normal.  Chest pain had resolved and felt to be noncardiac, possibly esophageal spasm versus stress/anxiety.  Should this recur, patient will follow-up with her PCP for further evaluation.  Assessment and Plan: * Chest pain-resolved as of 02/16/2022 Presented to emergency room with chest pain.  Troponins minimally elevated from 2 80-300.  Initially placed on heparin.  EKG unremarkable.  Patient underwent cardiac cath by cardiology which was found to be normal.  Echocardiogram also normal without any evidence of systolic or diastolic dysfunction or valvular issue.    ADD (attention deficit disorder) Advised patient that perhaps her Adderall may be causing some heightened sense of anxiety and palpitations and to monitor closely should chest pain recur.  Elevated BP without diagnosis of hypertension-resolved as of 02/16/2022 Initial blood pressure in the 140s however subsequent blood pressures  stable.  Elevated troponin As above.  Not felt to be ACS.  GERD (gastroesophageal reflux disease) Continue PPI  Obesity, Class III, BMI 40-49.9 (morbid obesity) (HCC) Meets criteria with BMI greater than 40         Consultants: Cardiology Procedures performed:  -Echocardiogram: Normal -cardiac catheterization: Normal Disposition: Home Diet recommendation:  Discharge Diet Orders (From admission, onward)     Start     Ordered   02/15/22 0000  Diet - low sodium heart healthy        02/15/22 1158           Regular diet DISCHARGE MEDICATION: Allergies as of 02/15/2022   No Known Allergies      Medication List     TAKE these medications    amphetamine-dextroamphetamine 10 MG tablet Commonly known as: ADDERALL Take one pill by mouth every am and one at lunchtime   celecoxib 200 MG capsule Commonly known as: CELEBREX TAKE 1 CAPSULE BY MOUTH TWICE A DAY   fluticasone 50 MCG/ACT nasal spray Commonly known as: FLONASE Place 2 sprays into both nostrils daily.   levonorgestrel-ethinyl estradiol 0.15-30 MG-MCG tablet Commonly known as: Altavera Take 1 tablet by mouth daily.   omeprazole 20 MG capsule Commonly known as: PRILOSEC Take 1 capsule (20 mg total) by mouth daily.   zolpidem 10 MG tablet Commonly known as: AMBIEN TAKE 1 TABLET BY MOUTH AT BEDTIME AS NEEDED FOR SLEEP        Discharge Exam: Filed Weights   02/13/22 1427  Weight: 117.9 kg   General: Alert and oriented x3, no acute distress Cardiovascular: Regular rate and rhythm, S1-S2 Lungs: Clear to auscultation bilaterally  Condition at discharge:  good  The results of significant diagnostics from this hospitalization (including imaging, microbiology, ancillary and laboratory) are listed below for reference.   Imaging Studies: ECHOCARDIOGRAM COMPLETE  Result Date: 02/15/2022    ECHOCARDIOGRAM REPORT   Patient Name:   [facility] Date of Exam: 02/15/2022 Medical Rec #:  [id number]      Height:       67.0 in Accession #:    [id number]    Weight:       260.0 lb Date of Birth:  [birth year]     BSA:          2.261 m Patient Age:    40 years      BP:           112/87 mmHg Patient Gender: F             HR:           61 bpm. Exam Location:  [facility] Procedure: 2D Echo, Color Doppler and Cardiac Doppler Indications:     R07.9 Chest Pain  History:         Patient has no prior history of Echocardiogram examinations.                  NSTEMI.  Sonographer:     Humphrey Rolls Referring Phys:  [phone] CHRISTOPHER END Diagnosing Phys: Debbe Odea MD  Sonographer Comments: Suboptimal apical window and no subcostal window. IMPRESSIONS  1. Left ventricular ejection fraction, by estimation, is 60 to 65%. The left ventricle has normal function. The left ventricle has no regional wall motion abnormalities. Left ventricular diastolic parameters were normal.  2. Right ventricular systolic function is normal. The right ventricular size is not well visualized.  3. The mitral valve is normal in structure. No evidence of mitral valve regurgitation.  4. The aortic valve is grossly normal. Aortic valve regurgitation is not visualized.  5. The inferior vena cava is normal in size with greater than 50% respiratory variability, suggesting right atrial pressure of 3 mmHg. FINDINGS  Left Ventricle: Left ventricular ejection fraction, by estimation, is 60 to 65%. The left ventricle has normal function. The left ventricle has no regional wall motion abnormalities. The left ventricular internal cavity size was normal in size. There is  no left ventricular hypertrophy. Left ventricular diastolic parameters were normal. Right Ventricle: The right ventricular size is not well visualized. No increase in right ventricular wall thickness. Right ventricular systolic function is normal. Left Atrium: Left atrial size was normal in size. Right Atrium: Right atrial size was normal in size. Pericardium: There is no evidence of pericardial effusion.  Mitral Valve: The mitral valve is normal in structure. No evidence of mitral valve regurgitation. Tricuspid Valve: The tricuspid valve is normal in structure. Tricuspid valve regurgitation is trivial. Aortic Valve: The aortic valve is grossly normal. Aortic valve regurgitation is not visualized. Aortic valve mean gradient measures 3.0 mmHg. Aortic valve peak gradient measures 6.0 mmHg. Aortic valve area, by VTI measures 3.25 cm. Pulmonic Valve: The pulmonic valve was not well visualized. Pulmonic valve regurgitation is not visualized. Aorta: The aortic root and ascending aorta are structurally normal, with no evidence of dilitation. Venous: The inferior vena cava is normal in size with greater than 50% respiratory variability, suggesting right atrial pressure of 3 mmHg. IAS/Shunts: No atrial level shunt detected by color flow Doppler.  LEFT VENTRICLE PLAX 2D LVIDd:         4.71 cm   Diastology LVIDs:  3.37 cm   LV e' medial:    8.16 cm/s LV PW:         1.03 cm   LV E/e' medial:  9.8 LV IVS:        0.82 cm   LV e' lateral:   12.00 cm/s LVOT diam:     2.10 cm   LV E/e' lateral: 6.6 LV SV:         88 LV SV Index:   39 LVOT Area:     3.46 cm  RIGHT VENTRICLE RV Basal diam:  4.11 cm TAPSE (M-mode): 2.4 cm LEFT ATRIUM             Index        RIGHT ATRIUM           Index LA diam:        3.20 cm 1.42 cm/m   RA Area:     14.70 cm LA Vol (A2C):   25.0 ml 11.06 ml/m  RA Volume:   38.40 ml  16.99 ml/m LA Vol (A4C):   30.2 ml 13.36 ml/m LA Biplane Vol: 30.2 ml 13.36 ml/m  AORTIC VALVE AV Area (Vmax):    3.21 cm AV Area (Vmean):   3.16 cm AV Area (VTI):     3.25 cm AV Vmax:           122.00 cm/s AV Vmean:          85.000 cm/s AV VTI:            0.270 m AV Peak Grad:      6.0 mmHg AV Mean Grad:      3.0 mmHg LVOT Vmax:         113.00 cm/s LVOT Vmean:        77.500 cm/s LVOT VTI:          0.253 m LVOT/AV VTI ratio: 0.94  AORTA Ao Root diam: 2.90 cm MITRAL VALVE MV Area (PHT): 3.21 cm    SHUNTS MV Decel Time: 236  msec    Systemic VTI:  0.25 m MV E velocity: 79.70 cm/s  Systemic Diam: 2.10 cm MV A velocity: 60.40 cm/s MV E/A ratio:  1.32 Debbe Odea MD Electronically signed by Debbe Odea MD Signature Date/Time: 02/15/2022/10:28:28 AM    Final    CARDIAC CATHETERIZATION  Result Date: 02/14/2022 Conclusions: No angiographically significant coronary artery disease.  Consider alternative causes of chest pain and elevated troponin such as microvascular disease, myopericarditis, or vasospasm. Normal left ventricular systolic function with mildly elevated filling pressure. Recommendations: Discontinue IV heparin given no significant coronary artery disease. Follow-up echocardiogram. Defer cardiac rehab referral, as this does not represent a type 1 MI. Yvonne Kendall, MD [facility]  CT Angio Chest PE W and/or Wo Contrast  Result Date: 02/13/2022 CLINICAL DATA:  Chest pain starting yesterday. EXAM: CT ANGIOGRAPHY CHEST WITH CONTRAST TECHNIQUE: Multidetector CT imaging of the chest was performed using the standard protocol during bolus administration of intravenous contrast. Multiplanar CT image reconstructions and MIPs were obtained to evaluate the vascular anatomy. RADIATION DOSE REDUCTION: This exam was performed according to the departmental dose-optimization program which includes automated exposure control, adjustment of the mA and/or kV according to patient size and/or use of iterative reconstruction technique. CONTRAST:  5mL OMNIPAQUE IOHEXOL 350 MG/ML SOLN COMPARISON:  None Available. FINDINGS: Cardiovascular: Satisfactory opacification of the pulmonary arteries to the segmental level. No evidence of pulmonary embolism. Normal heart size. No pericardial effusion. Mediastinum/Nodes: No enlarged mediastinal, hilar, or  axillary lymph nodes. Thyroid gland, trachea, and esophagus demonstrate no significant findings. Lungs/Pleura: Lungs are clear. No pleural effusion or pneumothorax. Upper Abdomen: No acute  abnormality. Musculoskeletal: No chest wall abnormality. No acute or significant osseous findings. Review of the MIP images confirms the above findings. IMPRESSION: No acute pulmonary embolism or other acute intrathoracic process. Electronically Signed   By: Emmaline Kluver M.D.   On: 02/13/2022 17:46   DG Chest 2 View  Result Date: 02/13/2022 CLINICAL DATA:  Onset of sharp midsternal chest pain yesterday radiating to neck and jaw, better today EXAM: CHEST - 2 VIEW COMPARISON:  None FINDINGS: Normal heart size, mediastinal contours, and pulmonary vascularity. Lungs clear. No pleural effusion or pneumothorax. Bones unremarkable. IMPRESSION: Normal exam. Electronically Signed   By: Ulyses Southward M.D.   On: 02/13/2022 14:49    Microbiology: No results found for this or any previous visit.  Labs: CBC: Recent Labs  Lab 02/13/22 1428 02/14/22 0656 02/15/22 0440  WBC 7.6 6.5 8.3  HGB 12.3 11.9* 12.7  HCT 38.1 36.7 39.1  MCV 82.5 81.7 81.0  PLT 241 224 269   Basic Metabolic Panel: Recent Labs  Lab 02/13/22 1428 02/15/22 0440  NA 137 140  K 3.6 4.1  CL 109 112*  CO2 22 20*  GLUCOSE 108* 85  BUN 7 9  CREATININE 0.84 0.95  CALCIUM 8.6* 8.9   Liver Function Tests: No results for input(s): "AST", "ALT", "ALKPHOS", "BILITOT", "PROT", "ALBUMIN" in the last 168 hours. CBG: No results for input(s): "GLUCAP" in the last 168 hours.  Discharge time spent: less than 30 minutes.  Signed: Hollice Espy, MD [facility] 02/16/2022

## 2022-02-17 ENCOUNTER — Telehealth: Payer: Self-pay

## 2022-02-17 NOTE — Telephone Encounter (Signed)
Transition Care Management Follow-up Telephone Call Date of discharge and from where: [facility] 02/13/22-02/15/22 How have you been since you were released from the hospital? "I am feeling really good!" Any questions or concerns? No  Items Reviewed: Did the pt receive and understand the discharge instructions provided? Yes  Medications obtained and verified?  No medication changes Other? No  Any new allergies since your discharge? No  Dietary orders reviewed? Yes Do you have support at home? Yes -Husband and sons  Home Care and Equipment/Supplies: Were home health services ordered? no If so, what is the name of the agency? N/A  Has the agency set up a time to come to the patient's home? not applicable Were any new equipment or medical supplies ordered?  No What is the name of the medical supply agency? N/A Were you able to get the supplies/equipment? not applicable Do you have any questions related to the use of the equipment or supplies? No  Functional Questionnaire: (I = Independent and D = Dependent) ADLs: I  Bathing/Dressing- I  Meal Prep- I  Eating- I  Maintaining continence- I  Transferring/Ambulation- I  Managing Meds- I  Follow up appointments reviewed:  PCP Hospital f/u appt confirmed?  To call PCP if any issues . Specialist Hospital f/u appt confirmed? No  MD felt it was not necessary Are transportation arrangements needed? No  If their condition worsens, is the pt aware to call PCP or go to the Emergency Dept.? Yes Was the patient provided with contact information for the PCP's office or ED? Yes Was to pt encouraged to call back with questions or concerns? Yes Jodelle Gross, RN, BSN, CCM Care Management Coordinator Phone: [phone]/Fax: [phone]

## 2022-02-22 ENCOUNTER — Other Ambulatory Visit: Payer: 59

## 2022-03-08 ENCOUNTER — Ambulatory Visit: Payer: 59 | Admitting: Family Medicine

## 2022-03-08 ENCOUNTER — Encounter: Payer: Self-pay | Admitting: Family Medicine

## 2022-03-08 VITALS — BP 116/80 | HR 103 | Ht 67.0 in | Wt 251.2 lb

## 2022-03-08 DIAGNOSIS — E538 Deficiency of other specified B group vitamins: Secondary | ICD-10-CM | POA: Diagnosis not present

## 2022-03-08 DIAGNOSIS — E559 Vitamin D deficiency, unspecified: Secondary | ICD-10-CM | POA: Diagnosis not present

## 2022-03-08 DIAGNOSIS — R072 Precordial pain: Secondary | ICD-10-CM | POA: Diagnosis not present

## 2022-03-08 NOTE — Assessment & Plan Note (Signed)
Lab Results  Component Value Date   VITAMINB12 153 (L) 06/07/2021   No shots , just orally supplementing 1000 mcg today  Helps her energy level  Level ordered today

## 2022-03-08 NOTE — Assessment & Plan Note (Signed)
Now taking D3 2000 iu daily  Level today  Disc imp to bone and overall health

## 2022-03-08 NOTE — Progress Notes (Signed)
Subjective:    Patient ID: [name], female    DOB: 02-16-1981, 41 y.o.   MRN: [id number]  HPI Pt presents for f/u of hosp for chest pain   Wt Readings from Last 3 Encounters:  03/08/22 251 lb 3.2 oz (113.9 kg)  02/13/22 260 lb (117.9 kg)  10/18/21 259 lb 1 oz (117.5 kg)   39.34 kg/m  She was admitted from 6/26/to 6/28 for chest pain  When in ER troponins were elevated /peaked at 309 CT and r/o PE Cardiology saw  Echo nl  Cp resolved /-? Stress related     Hosp a/p Assessment and Plan: * Chest pain-resolved as of 02/16/2022 Presented to emergency room with chest pain.  Troponins minimally elevated from 2 80-300.  Initially placed on heparin.  EKG unremarkable.  Patient underwent cardiac cath by cardiology which was found to be normal.  Echocardiogram also normal without any evidence of systolic or diastolic dysfunction or valvular issue.     ADD (attention deficit disorder) Advised patient that perhaps her Adderall may be causing some heightened sense of anxiety and palpitations and to monitor closely should chest pain recur.   Of note-does not take on weekends and this happened on a weekend  Elevated BP without diagnosis of hypertension-resolved as of 02/16/2022 Initial blood pressure in the 140s however subsequent blood pressures stable.   Elevated troponin As above.  Not felt to be ACS.   GERD (gastroesophageal reflux disease) Continue PPI   Obesity, Class III, BMI 40-49.9 (morbid obesity) (HCC) Meets criteria with BMI greater than 40  BP Readings from Last 3 Encounters:  03/08/22 116/80  02/15/22 112/87  02/13/22 129/84   Pulse Readings from Last 3 Encounters:  03/08/22 (!) 103  02/15/22 70  02/13/22 90   Heart cath on 6/27 was normal with Dr End :  Conclusions: No angiographically significant coronary artery disease.  Consider alternative causes of chest pain and elevated troponin such as microvascular disease, myopericarditis, or  vasospasm. Normal left ventricular systolic function with mildly elevated filling pressure.   Lab Results  Component Value Date   CREATININE 0.95 02/15/2022   BUN 9 02/15/2022   NA 140 02/15/2022   K 4.1 02/15/2022   CL 112 (H) 02/15/2022   CO2 20 (L) 02/15/2022   Lab Results  Component Value Date   ALT 11 06/07/2021   AST 13 06/07/2021   ALKPHOS 54 06/07/2021   BILITOT 0.5 06/07/2021   Glucose 112 non fasting   Lab Results  Component Value Date   CHOL 149 02/14/2022   HDL 35 (L) 02/14/2022   LDLCALC 84 02/14/2022   TRIG 152 (H) 02/14/2022   CHOLHDL 4.3 02/14/2022  HDL is low  Day to day activity for exercise   No more symptoms now Had few days of L side of her body cramping - gone now  ? Residual from hospital   Doing fine now Back to normal    H/o low B12 Lab Results  Component Value Date   VITAMINB12 153 (L) 06/07/2021   Asked to get a shot and then take 1000 mcg daily oral  Never got a shot  Takes the 1000 mcg   She feels better taking them    Vit D was low also at 24.4  Taking this as well   GERD- no heartburn trouble   Stress level is constant and high  She is used to it  She gets by with it and does ok / may function  better with stress- keeps her motivated   Fine with adderall  She does not take on weekends   Self care is good  Thinks she gets enough rest and exercise    Patient Active Problem List   Diagnosis Date Noted   Vitamin D deficiency 03/08/2022   Elevated troponin 02/14/2022   Chest pain 02/13/2022   Upper respiratory tract infection 10/18/2021   Routine general medical examination at a health care facility 06/07/2021   Current use of proton pump inhibitor 06/07/2021   Encounter for screening mammogram for breast cancer 06/07/2021   Vitamin B12 deficiency 06/07/2021   Elevated TSH 06/15/2020   Encounter for screening for lipoid disorders 05/31/2020   Obesity, Class III, BMI 40-49.9 (morbid obesity) (HCC) 03/03/2020    Cubital tunnel syndrome on right 08/22/2018   GERD (gastroesophageal reflux disease) 01/01/2018   H/O migraine 10/11/2015   ADD (attention deficit disorder) 09/03/2014   Insomnia 09/02/2012   Past Medical History:  Diagnosis Date   ADD (attention deficit disorder)    Elevated BP without diagnosis of hypertension    Elevated TSH    GERD (gastroesophageal reflux disease)    Insomnia    Migraines    Past Surgical History:  Procedure Laterality Date   LEFT HEART CATH AND CORONARY ANGIOGRAPHY N/A 02/14/2022   Procedure: LEFT HEART CATH AND CORONARY ANGIOGRAPHY;  Surgeon: Yvonne Kendall, MD;  Location: [facility] INVASIVE CV LAB;  Service: Cardiovascular;  Laterality: N/A;   Social History   Tobacco Use   Smoking status: Never   Smokeless tobacco: Never  Substance Use Topics   Alcohol use: Yes    Alcohol/week: 0.0 standard drinks of alcohol    Comment: occasional    Drug use: No   Family History  Problem Relation Age of Onset   Healthy Mother    Cancer Father    No Known Allergies Current Outpatient Medications on File Prior to Visit  Medication Sig Dispense Refill   amphetamine-dextroamphetamine (ADDERALL) 10 MG tablet Take one pill by mouth every am and one at lunchtime 60 tablet 0   levonorgestrel-ethinyl estradiol (ALTAVERA) 0.15-30 MG-MCG tablet Take 1 tablet by mouth daily. 84 tablet 3   omeprazole (PRILOSEC) 20 MG capsule Take 1 capsule (20 mg total) by mouth daily. 90 capsule 3   zolpidem (AMBIEN) 10 MG tablet TAKE 1 TABLET BY MOUTH AT BEDTIME AS NEEDED FOR SLEEP 30 tablet 3   No current facility-administered medications on file prior to visit.    Review of Systems  Constitutional:  Negative for activity change, appetite change, fatigue, fever and unexpected weight change.  HENT:  Negative for congestion, ear pain, rhinorrhea, sinus pressure and sore throat.   Eyes:  Negative for pain, redness and visual disturbance.  Respiratory:  Negative for cough, shortness of  breath and wheezing.   Cardiovascular:  Negative for chest pain, palpitations and leg swelling.       No longer has chest pain   Gastrointestinal:  Negative for abdominal pain, blood in stool, constipation and diarrhea.  Endocrine: Negative for polydipsia and polyuria.  Genitourinary:  Negative for dysuria, frequency and urgency.  Musculoskeletal:  Negative for arthralgias, back pain and myalgias.  Skin:  Negative for pallor and rash.  Allergic/Immunologic: Negative for environmental allergies.  Neurological:  Negative for dizziness, syncope and headaches.  Hematological:  Negative for adenopathy. Does not bruise/bleed easily.  Psychiatric/Behavioral:  Negative for decreased concentration and dysphoric mood. The patient is not nervous/anxious.  Objective:   Physical Exam Constitutional:      General: She is not in acute distress.    Appearance: Normal appearance. She is well-developed. She is obese. She is not ill-appearing or diaphoretic.  HENT:     Head: Normocephalic and atraumatic.     Mouth/Throat:     Mouth: Mucous membranes are moist.     Pharynx: Oropharynx is clear.  Eyes:     General: No scleral icterus.    Conjunctiva/sclera: Conjunctivae normal.     Pupils: Pupils are equal, round, and reactive to light.  Neck:     Thyroid: No thyromegaly.     Vascular: No carotid bruit or JVD.  Cardiovascular:     Rate and Rhythm: Normal rate and regular rhythm.     Heart sounds: Normal heart sounds.     No gallop.  Pulmonary:     Effort: Pulmonary effort is normal. No respiratory distress.     Breath sounds: Normal breath sounds. No stridor. No wheezing, rhonchi or rales.  Chest:     Chest wall: No tenderness.  Abdominal:     General: There is no distension or abdominal bruit.     Palpations: Abdomen is soft. There is no mass.     Tenderness: There is no abdominal tenderness. There is no guarding or rebound.  Musculoskeletal:     Cervical back: Normal range of motion  and neck supple.     Right lower leg: No edema.     Left lower leg: No edema.  Lymphadenopathy:     Cervical: No cervical adenopathy.  Skin:    General: Skin is warm and dry.     Coloration: Skin is not pale.     Findings: No rash.  Neurological:     Mental Status: She is alert.     Coordination: Coordination normal.     Deep Tendon Reflexes: Reflexes are normal and symmetric. Reflexes normal.  Psychiatric:        Mood and Affect: Mood normal.           Assessment & Plan:   Problem List Items Addressed This Visit       Other   Chest pain - Primary    Pt presented to ER with neg EKG and min elevated troponins Reviewed hospital records, lab results and studies in detail   Worked up with admission, labs, CTA, echo, monitor and L sided cath  All neg for CAD Symptoms are resolved  Another cause not found Disc poss of vasc or esoph spasm  She tolerates her adderall  Consider further w/u if this happens again  Nl exam today        Vitamin B12 deficiency    Lab Results  Component Value Date   VITAMINB12 153 (L) 06/07/2021  No shots , just orally supplementing 1000 mcg today  Helps her energy level  Level ordered today      Relevant Orders   Vitamin B12   Vitamin D deficiency    Now taking D3 2000 iu daily  Level today  Disc imp to bone and overall health        Relevant Orders   VITAMIN D 25 Hydroxy (Vit-D Deficiency, Fractures)

## 2022-03-08 NOTE — Assessment & Plan Note (Signed)
Pt presented to ER with neg EKG and min elevated troponins Reviewed hospital records, lab results and studies in detail   Worked up with admission, labs, CTA, echo, monitor and L sided cath  All neg for CAD Symptoms are resolved  Another cause not found Disc poss of vasc or esoph spasm  She tolerates her adderall  Consider further w/u if this happens again  Nl exam today

## 2022-03-08 NOTE — Patient Instructions (Addendum)
Continue your current medicines  Watch your diet for things that upset your stomach  Avoid carbonation for the most part  If symptoms return please let me know   Take care of yourself  Eat a healthy diet and exercise   Vitamin B12 level today  Let's see if the oral supplementation is helping   Think about adding an exercise program also  This will raise your good cholesterol (HDL)

## 2022-03-09 LAB — VITAMIN D 25 HYDROXY (VIT D DEFICIENCY, FRACTURES): VITD: 34.4 ng/mL (ref 30.00–100.00)

## 2022-03-09 LAB — VITAMIN B12: Vitamin B-12: 383 pg/mL (ref 211–911)

## 2022-03-24 ENCOUNTER — Other Ambulatory Visit: Payer: Self-pay | Admitting: Family Medicine

## 2022-03-24 MED ORDER — AMPHETAMINE-DEXTROAMPHETAMINE 10 MG PO TABS: ORAL_TABLET | ORAL | 0 refills | Status: DC

## 2022-03-24 NOTE — Telephone Encounter (Signed)
Caller Name: Marilouise Densmore Call back phone #: [phone]  MEDICATION(S): Adderall  Days of Med Remaining: 1 day   Has the patient contacted their pharmacy (YES/NO)? No, controlled What did pharmacy advise?  [facility] [address]  ~~~Please advise patient/caregiver to allow 2-3 business days to process RX refills.

## 2022-03-24 NOTE — Telephone Encounter (Signed)
Refill request Adderall Last refill 01/20/22 #60 Last office visit 03/08/22

## 2022-03-29 ENCOUNTER — Ambulatory Visit: Payer: 59

## 2022-03-29 DIAGNOSIS — M722 Plantar fascial fibromatosis: Secondary | ICD-10-CM

## 2022-03-29 NOTE — Progress Notes (Signed)
Patient presents today to pick up custom molded foot orthotics, diagnosed with plantar fasciitis by Dr. Hyatt.   Orthotics were dispensed and fit was satisfactory. Reviewed instructions for break-in and wear. Written instructions given to patient.  Patient will follow up as needed.      

## 2022-04-25 ENCOUNTER — Other Ambulatory Visit: Payer: Self-pay | Admitting: Nurse Practitioner

## 2022-04-25 DIAGNOSIS — J069 Acute upper respiratory infection, unspecified: Secondary | ICD-10-CM

## 2022-04-26 ENCOUNTER — Other Ambulatory Visit: Payer: Self-pay | Admitting: Family Medicine

## 2022-04-26 MED ORDER — ZOLPIDEM TARTRATE 10 MG PO TABS: 10.0000 mg | ORAL_TABLET | Freq: Every evening | ORAL | 3 refills | Status: DC | PRN

## 2022-04-26 NOTE — Telephone Encounter (Signed)
We didn't deny or receive a refill for Ambien, will route a request to PCP  Name of Medication: Ambien [facility] [address] Lavonia Dana or Written Date and Quantity: 12/27/21 #30 tabs/ 3 refill Last Office Visit and Type: f/u on 03/08/22 Next Office Visit and Type: none scheduled

## 2022-04-26 NOTE — Telephone Encounter (Signed)
Patient called in stating that the pharmacy informed her that her rx zolpidem (AMBIEN) 10 MG tablet was denied by the doctor,she would like to know why?

## 2022-05-10 ENCOUNTER — Other Ambulatory Visit: Payer: Self-pay | Admitting: Family Medicine

## 2022-05-10 MED ORDER — AMPHETAMINE-DEXTROAMPHETAMINE 10 MG PO TABS
ORAL_TABLET | ORAL | 0 refills | Status: DC
Start: 2022-05-10 — End: 2022-06-26

## 2022-05-10 NOTE — Telephone Encounter (Signed)
Name of Medication: Adderall Name of Pharmacy: [place] [address] Last Fill or Written Date and Quantity: 03/24/22 #60 tabs/ 0 refills Last Office Visit and Type: f/u on 03/08/22 Next Office Visit and Type: none scheduled

## 2022-05-10 NOTE — Telephone Encounter (Signed)
  Encourage patient to contact the pharmacy for refills or they can request refills through [facility]  Did the patient contact the pharmacy:  n   LAST APPOINTMENT DATE: 03/08/22  NEXT APPOINTMENT DATE:  MEDICATION:amphetamine-dextroamphetamine (ADDERALL) 10 MG tablet  Is the patient out of medication? 1 day left  If not, how much is left?  Is this a 90 day supply: 60 tablets  Phone:  [phone]  Fax:  [phone]     [facility]ACY[id number], [place] Phone:  [phone]  Fax:  [phone]      Let patient know to contact pharmacy at the end of the day to make sure medication is ready.  Please notify patient to allow 48-72 hours to process

## 2022-06-07 ENCOUNTER — Other Ambulatory Visit: Payer: Self-pay | Admitting: Family Medicine

## 2022-06-26 ENCOUNTER — Other Ambulatory Visit: Payer: Self-pay | Admitting: Family Medicine

## 2022-06-26 MED ORDER — AMPHETAMINE-DEXTROAMPHETAMINE 10 MG PO TABS: ORAL_TABLET | ORAL | 0 refills | Status: DC

## 2022-06-26 NOTE — Telephone Encounter (Signed)
Name of Medication: Adderall Name of Pharmacy: [place] [address] Last Fill or Written Date and Quantity: 05/10/22 #60 tabs/ 0 refills Last Office Visit and Type: f/u on 03/08/22 Next Office Visit and Type: none scheduled

## 2022-06-26 NOTE — Telephone Encounter (Signed)
Patient called to see if the medication was sent in yet.

## 2022-06-26 NOTE — Telephone Encounter (Signed)
  Encourage patient to contact the pharmacy for refills or they can request refills through [facility]  Did the patient contact the pharmacy:no     LAST APPOINTMENT DATE:  Please schedule appointment if longer than 1 year 03/29/2022 NEXT APPOINTMENT DATE:  MEDICATION:amphetamine-dextroamphetamine (ADDERALL) 10 MG tablet   Is the patient out of medication? yes  If not, how much is left?  Is this a 90 day supply:   [id number] [address], [place] Phone: [phone]  Fax: [phone]      Let patient know to contact pharmacy at the end of the day to make sure medication is ready.  Please notify patient to allow 48-72 hours to process  CLINICAL FILLS OUT ALL BELOW:

## 2022-08-13 ENCOUNTER — Emergency Department
Admission: EM | Admit: 2022-08-13 | Discharge: 2022-08-13 | Disposition: A | Discharge status: Home / Self Care | Payer: 59 | Attending: Emergency Medicine | Admitting: Emergency Medicine

## 2022-08-13 ENCOUNTER — Other Ambulatory Visit: Payer: Self-pay

## 2022-08-13 ENCOUNTER — Emergency Department: Payer: 59

## 2022-08-13 ENCOUNTER — Ambulatory Visit: Admission: EM | Admit: 2022-08-13 | Discharge: 2022-08-13 | Discharge status: Against Medical Advice | Payer: 59

## 2022-08-13 DIAGNOSIS — R1011 Right upper quadrant pain: Secondary | ICD-10-CM

## 2022-08-13 DIAGNOSIS — K5792 Diverticulitis of intestine, part unspecified, without perforation or abscess without bleeding: Secondary | ICD-10-CM

## 2022-08-13 LAB — URINALYSIS, ROUTINE W REFLEX MICROSCOPIC
Bilirubin Urine: NEGATIVE
Glucose, UA: NEGATIVE mg/dL
Hgb urine dipstick: NEGATIVE
Ketones, ur: 5 mg/dL — AB
Nitrite: NEGATIVE
Protein, ur: NEGATIVE mg/dL
Specific Gravity, Urine: 1.015 (ref 1.005–1.030)
pH: 6 (ref 5.0–8.0)

## 2022-08-13 LAB — CBC
HCT: 37.5 % (ref 36.0–46.0)
Hemoglobin: 12.2 g/dL (ref 12.0–15.0)
MCH: 26.3 pg (ref 26.0–34.0)
MCHC: 32.5 g/dL (ref 30.0–36.0)
MCV: 81 fL (ref 80.0–100.0)
Platelets: 264 10*3/uL (ref 150–400)
RBC: 4.63 MIL/uL (ref 3.87–5.11)
RDW: 15 % (ref 11.5–15.5)
WBC: 11.5 10*3/uL — ABNORMAL HIGH (ref 4.0–10.5)
nRBC: 0 % (ref 0.0–0.2)

## 2022-08-13 LAB — COMPREHENSIVE METABOLIC PANEL
ALT: 12 U/L (ref 0–44)
AST: 12 U/L — ABNORMAL LOW (ref 15–41)
Albumin: 3.5 g/dL (ref 3.5–5.0)
Alkaline Phosphatase: 59 U/L (ref 38–126)
Anion gap: 7 (ref 5–15)
BUN: 6 mg/dL (ref 6–20)
CO2: 23 mmol/L (ref 22–32)
Calcium: 8.7 mg/dL — ABNORMAL LOW (ref 8.9–10.3)
Chloride: 107 mmol/L (ref 98–111)
Creatinine, Ser: 0.8 mg/dL (ref 0.44–1.00)
GFR, Estimated: >60 mL/min (ref >60)
Glucose, Bld: 90 mg/dL (ref 70–99)
Potassium: 3.6 mmol/L (ref 3.5–5.1)
Sodium: 137 mmol/L (ref 135–145)
Total Bilirubin: 1.5 mg/dL — ABNORMAL HIGH (ref 0.3–1.2)
Total Protein: 7 g/dL (ref 6.5–8.1)

## 2022-08-13 LAB — POC URINE PREG, ED: Preg Test, Ur: NEGATIVE

## 2022-08-13 LAB — LIPASE, BLOOD: Lipase: 30 U/L (ref 11–51)

## 2022-08-13 LAB — TROPONIN I (HIGH SENSITIVITY): Troponin I (High Sensitivity): <2 ng/L (ref <18)

## 2022-08-13 MED ORDER — IOHEXOL 300 MG/ML  SOLN
100.0000 mL | Freq: Once | INTRAMUSCULAR | Status: AC | PRN
  Administered 2022-08-13: 100 mL via INTRAVENOUS

## 2022-08-13 MED ORDER — AMOXICILLIN-POT CLAVULANATE 875-125 MG PO TABS: 1.0000 | ORAL_TABLET | Freq: Two times a day (BID) | ORAL | 0 refills | Status: DC

## 2022-08-13 MED ORDER — HYDROCODONE-ACETAMINOPHEN 5-325 MG PO TABS: 1.0000 | ORAL_TABLET | ORAL | 0 refills | Status: DC | PRN

## 2022-08-13 MED ORDER — AMOXICILLIN-POT CLAVULANATE 875-125 MG PO TABS: 1.0000 | ORAL_TABLET | Freq: Once | ORAL | Status: DC

## 2022-08-13 MED ORDER — MORPHINE SULFATE (PF) 4 MG/ML IV SOLN: 4.0000 mg | Freq: Once | INTRAVENOUS | Status: DC

## 2022-08-13 MED ORDER — ONDANSETRON HCL 4 MG/2ML IJ SOLN: 4.0000 mg | Freq: Once | INTRAMUSCULAR | Status: DC

## 2022-08-13 NOTE — ED Triage Notes (Signed)
Pt states she has had pain on the RUQ just below her ribs since Friday- pt denies n/v/d- pt states pain is worse when she takes a deep breath

## 2022-08-13 NOTE — ED Provider Notes (Signed)
-----------------------------------------   4:30 PM on 08/13/2022 ----------------------------------------- Patient care assumed from Dr. Darnelle Catalan.  Patient CT scan has resulted showing colonic changes at the hepatic flexure most consistent with diverticulitis.  Uncomplicated with no signs of perforation or abscess.  Given this we will discharge the patient on Augmentin, short course of pain medication have the patient follow-up with her doctor.  Urinalysis does show bacteria but no white cells no urinary symptoms.  I have added a culture as a precaution.   Minna Antis, MD 08/13/22 [phone]

## 2022-08-13 NOTE — ED Provider Notes (Signed)
[facility] Provider Note    Event Date/Time   First MD Initiated Contact with Patient 08/13/22 1153     (approximate)   History   Abdominal Pain   HPI  [name] is a 41 y.o. female past history of migraines and chest pain comes in complaining of right upper quadrant abdominal pain which is made worse with deep breathing.  The pain has been constant for over 24 hours.  In triage she said she had it starting on Friday.  She has no fever and has not had no nausea vomiting or diarrhea.     Physical Exam   Triage Vital Signs: ED Triage Vitals  Enc Vitals Group     BP 08/13/22 1038 128/81     Pulse Rate 08/13/22 1038 86     Resp 08/13/22 1038 18     Temp 08/13/22 1038 98.8 F (37.1 C)     Temp Source 08/13/22 1038 Oral     SpO2 08/13/22 1038 100 %     Weight 08/13/22 1039 260 lb (117.9 kg)     Height 08/13/22 1039 5\' 7"  (1.702 m)     Head Circumference --      Peak Flow --      Pain Score 08/13/22 1039 8     Pain Loc --      Pain Edu? --      Excl. in GC? --     Most recent vital signs: Vitals:   08/13/22 1038  BP: 128/81  Pulse: 86  Resp: 18  Temp: 98.8 F (37.1 C)  SpO2: 100%     General: Awake, complaining of pain pacing around the room because it hurts CV:  Good peripheral perfusion.  Heart regular rate and rhythm no audible murmurs Resp:  Normal effort.  Lungs are clear Abd:  No distention.  There is pain in the right upper quadrant of the abdomen just under the ribs almost at the anterior axillary line.  The pain is made worse with respirations.  There is no pain elsewhere in the abdomen. Extremities: No tenderness and no edema   ED Results / Procedures / Treatments   Labs (all labs ordered are listed, but only abnormal results are displayed) Labs Reviewed  COMPREHENSIVE METABOLIC PANEL - Abnormal; Notable for the following components:      Result Value   Calcium 8.7 (*)    AST 12 (*)    Total Bilirubin 1.5 (*)     All other components within normal limits  CBC - Abnormal; Notable for the following components:   WBC 11.5 (*)    All other components within normal limits  URINALYSIS, ROUTINE W REFLEX MICROSCOPIC - Abnormal; Notable for the following components:   Color, Urine YELLOW (*)    APPearance HAZY (*)    Ketones, ur 5 (*)    Leukocytes,Ua SMALL (*)    Bacteria, UA MANY (*)    All other components within normal limits  LIPASE, BLOOD  POC URINE PREG, ED  TROPONIN I (HIGH SENSITIVITY)     EKG  EKG read interpreted by me shows normal sinus rhythm rate of 99 normal axis nonspecific ST-T wave changes   RADIOLOGY  Ultrasound of the right upper quadrant read by radiology reviewed and interpreted by me is negative.  PROCEDURES:  Critical Care performed:   Procedures   MEDICATIONS ORDERED IN ED: Medications  morphine (PF) 4 MG/ML injection 4 mg (4 mg Intravenous Patient Refused/Not Given 08/13/22 1351)  ondansetron (ZOFRAN) injection 4 mg (4 mg Intravenous Patient Refused/Not Given 08/13/22 1351)     IMPRESSION / MDM / ASSESSMENT AND PLAN / ED COURSE  I reviewed the triage vital signs and the nursing notes.   ----------------------------------------- 3:24 PM on 08/13/2022 ----------------------------------------- I have signed this patient out to the oncoming provider.  Differential diagnosis includes, but is not limited to, cholecystitis, a calculus cholecystitis, hepatitis, pericapsular abscess  Patient's presentation is most consistent with acute presentation with potential threat to life or bodily function.  The patient is on the cardiac monitor to evaluate for evidence of arrhythmia and/or significant heart rate changes.  Nothing has been seen   FINAL CLINICAL IMPRESSION(S) / ED DIAGNOSES   Final diagnoses:  Right upper quadrant abdominal pain     Rx / DC Orders   ED Discharge Orders     None        Note:  This document was prepared using Dragon voice  recognition software and may include unintentional dictation errors.   Arnaldo Natal, MD 08/13/22 [phone]

## 2022-08-13 NOTE — ED Notes (Signed)
A- Urine ( )

## 2022-08-13 NOTE — Discharge Instructions (Addendum)
Your CT scan is consistent with diverticulitis.  Please take antibiotics as prescribed for the next 10 days.  Please take pain medication if needed but only as prescribed.  Do not drink alcohol or drive while taking the pain medication.

## 2022-08-13 NOTE — ED Notes (Signed)
Pt given urine cup.

## 2022-08-13 NOTE — ED Notes (Addendum)
PT arrived with complaint of RUQ pain. Pt states it is tender to the touch, a stabbing constant pain. Pt denies vomiting. Pt A&Ox4. NAD.

## 2022-08-15 LAB — URINE CULTURE

## 2022-08-24 ENCOUNTER — Other Ambulatory Visit: Payer: Self-pay | Admitting: Family Medicine

## 2022-08-25 ENCOUNTER — Other Ambulatory Visit: Payer: Self-pay | Admitting: Family Medicine

## 2022-08-25 MED ORDER — AMPHETAMINE-DEXTROAMPHETAMINE 10 MG PO TABS: ORAL_TABLET | ORAL | 0 refills | Status: DC

## 2022-08-25 NOTE — Telephone Encounter (Signed)
Name of Medication: adderall Name of Pharmacy: [place] or Written Date and Quantity: 06/26/22 #60 tabs/ 0 refill Last Office Visit and Type: f/u on 03/08/22 Next Office Visit and Type: none scheduled

## 2022-08-25 NOTE — Telephone Encounter (Signed)
Name of Medication: Ambien Name of [facility]acy: [facility] [address] Henrietta Dine or Written Date and Quantity: 04/26/22 #30 tabs/ 3 refill Last Office Visit and Type: f/u on 03/08/22 Next Office Visit and Type: none scheduled

## 2022-08-25 NOTE — Telephone Encounter (Signed)
Prescription Request  08/25/2022  Is this a "Controlled Substance" medicine? No  LOV: 03/08/2022  What is the name of the medication or equipment? amphetamine-dextroamphetamine (ADDERALL) 10 MG tablet  zolpidem (AMBIEN) 10 MG tablet   Have you contacted your pharmacy to request a refill? Yes   Which pharmacy would you like this sent to?  [id number] [address] [place] [address] Phone: [phone] Fax: [phone]    Patient notified that their request is being sent to the clinical staff for review and that they should receive a response within 2 business days.   Please advise at Mobile [phone] (mobile)

## 2022-10-10 ENCOUNTER — Other Ambulatory Visit: Payer: Self-pay | Admitting: Family Medicine

## 2022-10-10 MED ORDER — AMPHETAMINE-DEXTROAMPHETAMINE 10 MG PO TABS: ORAL_TABLET | ORAL | 0 refills | Status: DC

## 2022-10-10 NOTE — Telephone Encounter (Signed)
Prescription Request  10/10/2022  Is this a "Controlled Substance" medicine? Yes  LOV: 03/08/2022  What is the name of the medication or equipment?  amphetamine-dextroamphetamine (ADDERALL) 10 MG tablet   Have you contacted your pharmacy to request a refill? No   Which pharmacy would you like this sent to?  [id number] [address]Phone: [phone]: [phone]   Patient notified that their request is being sent to the clinical staff for review and that they should receive a response within 2 business days.   Please advise at Mobile [phone](mobile)

## 2022-10-10 NOTE — Telephone Encounter (Signed)
Name of Medication: adderall Name of Pharmacy: [place] or Written Date and Quantity: 08/25/22 #60 tabs/ 0 refill Last Office Visit and Type: f/u on 03/08/22 Next Office Visit and Type: f/u on 11/06/22

## 2022-11-06 ENCOUNTER — Encounter: Payer: Self-pay | Admitting: Family Medicine

## 2022-11-06 ENCOUNTER — Ambulatory Visit: Payer: 59 | Admitting: Family Medicine

## 2022-11-06 VITALS — BP 130/77 | HR 86 | Temp 97.9°F | Ht 67.0 in | Wt 267.5 lb

## 2022-11-06 DIAGNOSIS — F988 Other specified behavioral and emotional disorders with onset usually occurring in childhood and adolescence: Secondary | ICD-10-CM | POA: Diagnosis not present

## 2022-11-06 DIAGNOSIS — G4700 Insomnia, unspecified: Secondary | ICD-10-CM

## 2022-11-06 DIAGNOSIS — K2100 Gastro-esophageal reflux disease with esophagitis, without bleeding: Secondary | ICD-10-CM

## 2022-11-06 DIAGNOSIS — Z1231 Encounter for screening mammogram for malignant neoplasm of breast: Secondary | ICD-10-CM

## 2022-11-06 NOTE — Assessment & Plan Note (Signed)
Does well with adderall 10 mg bid  Good organization Is efficient at work  Tolerates well

## 2022-11-06 NOTE — Progress Notes (Signed)
Subjective:    Patient ID: [name], female    DOB: [birth year], 42 y.o.   MRN: [id number]  HPI Pt presents for f/u of chronic health problems including ADD, GERD and insomnia   Wt Readings from Last 3 Encounters:  11/06/22 267 lb 8 oz (121.3 kg)  08/13/22 260 lb (117.9 kg)  03/08/22 251 lb 3.2 oz (113.9 kg)   41.90 kg/m  Vitals:   11/06/22 0800  BP: (!) 146/64  Pulse: 86  Temp: 97.9 F (36.6 C)  SpO2: 100%   Feels ok   Started baseball season recently (travel and school) One kid in college  Very busy   ADD Takes adderall 10 mg in am and at mid day  Working well  She tolerates it  Gets work done well / efficient    On ppi for GERD Omeprazole = 20 mg  Cannot miss a day    Takes fiber supplement also - helps also    Keeps a pill box  Eating fruits and vegetables Probiotic yogurt Lot of fruit   The OC works well for her  Sees gyn   Lab Results  Component Value Date   TSH 4.36 06/07/2021       Vitalmin levels  Lab Results  Component Value Date   [id number] 03/08/2022  Taking B12 and d  Vitamin D level 34.4  In the summer   Mammogram: has not had  No lumps on self exam  Pap 06/2018 - gyn Dr Garwin Brothers   Insomnia  Uses every day  No weird experiences  Takes it on the way to bed    Patient Active Problem List   Diagnosis Date Noted   Vitamin D deficiency 03/08/2022   Elevated troponin 02/14/2022   Chest pain 02/13/2022   Routine general medical examination at a health care facility 06/07/2021   Current use of proton pump inhibitor 06/07/2021   Encounter for screening mammogram for breast cancer 06/07/2021   Vitamin B12 deficiency 06/07/2021   Elevated TSH 06/15/2020   Encounter for screening for lipoid disorders 05/31/2020   Obesity, Class III, BMI 40-49.9 (morbid obesity) ([place]) 03/03/2020   Cubital tunnel syndrome on right 08/22/2018   GERD (gastroesophageal reflux disease) 01/01/2018   H/O migraine 10/11/2015   ADD  (attention deficit disorder) 09/03/2014   Insomnia 09/02/2012   Past Medical History:  Diagnosis Date   ADD (attention deficit disorder)    Elevated BP without diagnosis of hypertension    Elevated TSH    GERD (gastroesophageal reflux disease)    Insomnia    Migraines    Past Surgical History:  Procedure Laterality Date   LEFT HEART CATH AND CORONARY ANGIOGRAPHY N/A 02/14/2022   Procedure: LEFT HEART CATH AND CORONARY ANGIOGRAPHY;  Surgeon: Nelva Bush, MD;  Location: [facility];  Service: Cardiovascular;  Laterality: N/A;   Social History   Tobacco Use   Smoking status: Never   Smokeless tobacco: Never  Substance Use Topics   Alcohol use: Yes    Alcohol/week: 0.0 standard drinks of alcohol    Comment: occasional    Drug use: No   Family History  Problem Relation Age of Onset   Healthy Mother    Cancer Father    No Known Allergies Current Outpatient Medications on File Prior to Visit  Medication Sig Dispense Refill   amphetamine-dextroamphetamine (ADDERALL) 10 MG tablet Take one pill by mouth every am and one at lunchtime 60 tablet 0   Cholecalciferol (VITAMIN D3)  25 MCG (1000 UT) CAPS Take 2,000 Units by mouth daily.     levonorgestrel-ethinyl estradiol (ALTAVERA) 0.15-30 MG-MCG tablet TAKE 1 TABLET BY MOUTH EVERY DAY 84 tablet 2   omeprazole (PRILOSEC) 20 MG capsule TAKE 1 CAPSULE BY MOUTH EVERY DAY 90 capsule 2   vitamin B-12 (CYANOCOBALAMIN) 1000 MCG tablet Take 1,000 mcg by mouth daily.     zolpidem (AMBIEN) 10 MG tablet TAKE 1 TABLET (10 MG TOTAL) BY MOUTH AT BEDTIME AS NEEDED. FOR SLEEP 30 tablet 3   No current facility-administered medications on file prior to visit.    Review of Systems  Constitutional:  Negative for activity change, appetite change, fatigue, fever and unexpected weight change.  HENT:  Negative for congestion, ear pain, rhinorrhea, sinus pressure and sore throat.   Eyes:  Negative for pain, redness and visual disturbance.   Respiratory:  Negative for cough, shortness of breath and wheezing.   Cardiovascular:  Negative for chest pain and palpitations.  Gastrointestinal:  Negative for abdominal pain, blood in stool, constipation and diarrhea.  Endocrine: Negative for polydipsia and polyuria.  Genitourinary:  Negative for dysuria, frequency and urgency.  Musculoskeletal:  Negative for arthralgias, back pain and myalgias.  Skin:  Negative for pallor and rash.  Allergic/Immunologic: Negative for environmental allergies.  Neurological:  Negative for dizziness, syncope and headaches.  Hematological:  Negative for adenopathy. Does not bruise/bleed easily.  Psychiatric/Behavioral:  Positive for decreased concentration and sleep disturbance. Negative for dysphoric mood. The patient is not nervous/anxious.        Objective:   Physical Exam Constitutional:      General: She is not in acute distress.    Appearance: Normal appearance. She is well-developed. She is not ill-appearing or diaphoretic.  HENT:     Head: Normocephalic and atraumatic.  Eyes:     Conjunctiva/sclera: Conjunctivae normal.     Pupils: Pupils are equal, round, and reactive to light.  Neck:     Thyroid: No thyromegaly.     Vascular: No carotid bruit or JVD.  Cardiovascular:     Rate and Rhythm: Normal rate and regular rhythm.     Heart sounds: Normal heart sounds.     No gallop.  Pulmonary:     Effort: Pulmonary effort is normal. No respiratory distress.     Breath sounds: Normal breath sounds. No wheezing or rales.  Abdominal:     General: There is no distension or abdominal bruit.     Palpations: Abdomen is soft.  Musculoskeletal:     Cervical back: Normal range of motion and neck supple.     Right lower leg: No edema.     Left lower leg: No edema.  Lymphadenopathy:     Cervical: No cervical adenopathy.  Skin:    General: Skin is warm and dry.     Coloration: Skin is not pale.     Findings: No rash.  Neurological:     Mental  Status: She is alert.     Cranial Nerves: No cranial nerve deficit.     Motor: No weakness or tremor.     Coordination: Coordination normal.     Deep Tendon Reflexes: Reflexes are normal and symmetric. Reflexes normal.  Psychiatric:        Attention and Perception: Attention normal.        Mood and Affect: Mood normal.        Cognition and Memory: Cognition and memory normal.           Assessment &  Plan:   Problem List Items Addressed This Visit       Digestive   GERD (gastroesophageal reflux disease)    On ppi omeprazole 20 mg daily  If doing well-disc trying it every other day  Would rather not give long term  Unsure if she will tol this Disc dietary change as well         Other   ADD (attention deficit disorder) - Primary    Does well with adderall 10 mg bid  Good organization Is efficient at work  Tolerates well         Encounter for screening mammogram for breast cancer    Mammogram ordered Pt will call to schedule it      Relevant Orders   MM 3D SCREENING MAMMOGRAM BILATERAL BREAST   Insomnia    Continues ambien  No side effects and works well   Understands sedation risk  When we someday stop it will have some rebound insomnia      Obesity, Class III, BMI 40-49.9 (morbid obesity) ([place])    Discussed how this problem influences overall health and the risks it imposes  Reviewed plan for weight loss with lower calorie diet (via better food choices and also portion control or program like weight watchers) and exercise building up to or more than 30 minutes 5 days per week including some aerobic activity

## 2022-11-06 NOTE — Assessment & Plan Note (Signed)
Mammogram ordered Pt will call to schedule it

## 2022-11-06 NOTE — Assessment & Plan Note (Signed)
On ppi omeprazole 20 mg daily  If doing well-disc trying it every other day  Would rather not give long term  Unsure if she will tol this Disc dietary change as well

## 2022-11-06 NOTE — Assessment & Plan Note (Signed)
Continues ambien  No side effects and works well   Understands sedation risk  When we someday stop it will have some rebound insomnia

## 2022-11-06 NOTE — Assessment & Plan Note (Signed)
Discussed how this problem influences overall health and the risks it imposes  Reviewed plan for weight loss with lower calorie diet (via better food choices and also portion control or program like weight watchers) and exercise building up to or more than 30 minutes 5 days per week including some aerobic activity    

## 2022-11-06 NOTE — Patient Instructions (Addendum)
If you want to try taking the omeprazole every other day - go ahead   Avoid foods that flare your acid reflux   Take care of yourself  Get exercise when you can  Eat a balanced diet   Follow up with gyn when it is time   Call the breast center to schedule your first mammogram  Please call the location of your choice from the menu below to schedule your Mammogram and/or Bone Density appointment.    [facility]                      Phone:  [phone] [address], [place] [address]                                                             Services: Traditional and 3D Mammogram, [place] [facility]                 Phone: [phone] [address], [place] [address]    Service: Bone Density ONLY   *this site does NOT perform mammograms  [facility]                        Phone:  [phone] [address], [place] [address]                                            Services:  3D Mammogram and [place] at [facility]   Phone:  [phone]   [place], [place] [address]                                            Services: 3D Mammogram and Bone Density  [facility]  [place] at [facility])  Phone:  [phone]   [address] [place], [place] [address]                                              Services:  3D Mammogram and Bone Density

## 2022-11-30 ENCOUNTER — Other Ambulatory Visit: Payer: Self-pay | Admitting: Family Medicine

## 2022-11-30 MED ORDER — AMPHETAMINE-DEXTROAMPHETAMINE 10 MG PO TABS: ORAL_TABLET | ORAL | 0 refills | Status: DC

## 2022-11-30 NOTE — Addendum Note (Signed)
Addended by: Roxy Manns A on: 11/30/2022 05:31 PM   Modules accepted: Orders

## 2022-11-30 NOTE — Telephone Encounter (Signed)
Prescription Request  11/30/2022  LOV: 11/06/2022  What is the name of the medication or equipment? amphetamine-dextroamphetamine (ADDERALL) 10 MG tablet   Have you contacted your pharmacy to request a refill? No   Which pharmacy would you like this sent to?  [id number] [address] [place] [address] Phone: [phone] Fax: [phone]    Patient notified that their request is being sent to the clinical staff for review and that they should receive a response within 2 business days.   Please advise at Mobile [phone] (mobile)

## 2022-11-30 NOTE — Telephone Encounter (Signed)
Name of Medication: adderall Name of Pharmacy: [facility]  Last Fill or Written Date and Quantity: 10/10/22 #60 tabs/ 0 refill Last Office Visit and Type: f/u on 11/06/22  Next Office Visit and Type: none scheduled

## 2022-11-30 NOTE — Addendum Note (Signed)
Addended by: Shon Millet on: 11/30/2022 09:23 AM   Modules accepted: Orders

## 2022-12-24 ENCOUNTER — Other Ambulatory Visit: Payer: Self-pay | Admitting: Family Medicine

## 2022-12-26 NOTE — Telephone Encounter (Signed)
Name of Medication: Ambien Name of Pharmacy: [facility] [address] Lavonia Dana or Written Date and Quantity: 08/25/22 #30 tabs/ 3 refill Last Office Visit and Type: f/u on 11/06/22 Next Office Visit and Type: none scheduled

## 2023-01-12 ENCOUNTER — Other Ambulatory Visit: Payer: Self-pay | Admitting: Family Medicine

## 2023-01-17 ENCOUNTER — Other Ambulatory Visit: Payer: Self-pay | Admitting: Family Medicine

## 2023-01-17 MED ORDER — AMPHETAMINE-DEXTROAMPHETAMINE 10 MG PO TABS: ORAL_TABLET | ORAL | 0 refills | Status: DC

## 2023-01-17 NOTE — Telephone Encounter (Signed)
Prescription Request  01/17/2023  LOV: 11/06/2022  What is the name of the medication or equipment? amphetamine-dextroamphetamine (ADDERALL) 10 MG tablet   Have you contacted your pharmacy to request a refill? No   Which pharmacy would you like this sent to?  [id number] [address] [place] [address] Phone: [phone] Fax: [phone]    Patient notified that their request is being sent to the clinical staff for review and that they should receive a response within 2 business days.   Please advise at Mobile [phone] (mobile)

## 2023-01-17 NOTE — Telephone Encounter (Signed)
Name of Medication: adderall Name of Pharmacy: [facility]  Last Fill or Written Date and Quantity: 11/30/22 #60 tabs/ 0 refill Last Office Visit and Type: f/u on 11/06/22  Next Office Visit and Type: none scheduled

## 2023-02-18 ENCOUNTER — Other Ambulatory Visit: Payer: Self-pay | Admitting: Family Medicine

## 2023-03-16 ENCOUNTER — Telehealth: Payer: Self-pay | Admitting: Family Medicine

## 2023-03-16 NOTE — Telephone Encounter (Signed)
Prescription Request  03/16/2023  LOV: 11/06/2022  What is the name of the medication or equipment? zolpidem (AMBIEN) 10 MG tablet, patient is going out of town and run out by Wednesday. Was wanting to know if she could get a refill before going out of town.   Have you contacted your pharmacy to request a refill? No   Which pharmacy would you like this sent to?  [id number] [address] [place] [address] Phone: [phone] Fax: [phone]    Patient notified that their request is being sent to the clinical staff for review and that they should receive a response within 2 business days.   Please advise at Mobile [phone] (mobile)

## 2023-03-16 NOTE — Telephone Encounter (Signed)
Please give the verbal ok to fill a bit early based on travel schedule Thanks

## 2023-03-16 NOTE — Telephone Encounter (Signed)
See prev comments. Last filled on 12/26/22 #30 tabs w/ 3 refills. Pt should still have refills on file, she should just need a verbal override from PCP to fill med early

## 2023-03-16 NOTE — Telephone Encounter (Signed)
Left message on VM at pharmacy to allow an early refill due to travel.

## 2023-04-09 ENCOUNTER — Other Ambulatory Visit: Payer: Self-pay | Admitting: Family Medicine

## 2023-04-09 MED ORDER — AMPHETAMINE-DEXTROAMPHETAMINE 10 MG PO TABS: ORAL_TABLET | ORAL | 0 refills | Status: DC

## 2023-04-09 NOTE — Telephone Encounter (Signed)
Name of Medication: adderall Name of Pharmacy: [facility]  Last Fill or Written Date and Quantity: 01/17/23 #60 tabs/ 0 refill Last Office Visit and Type: f/u on 11/06/22  Next Office Visit and Type: none scheduled

## 2023-04-09 NOTE — Telephone Encounter (Signed)
Prescription Request  04/09/2023  LOV: 11/06/2022  What is the name of the medication or equipment? amphetamine-dextroamphetamine (ADDERALL) 10 MG tablet    Have you contacted your pharmacy to request a refill? No   Which pharmacy would you like this sent to?  [id number] [address] [place] [address] Phone: [phone] Fax: [phone]    Patient notified that their request is being sent to the clinical staff for review and that they should receive a response within 2 business days.   Please advise at Mobile [phone] (mobile)

## 2023-04-17 ENCOUNTER — Other Ambulatory Visit: Payer: Self-pay | Admitting: Family Medicine

## 2023-04-18 NOTE — Telephone Encounter (Signed)
Name of Medication: Ambien Name of [facility]acy[facility] Lavonia Dana or Written Date and Quantity: 12/26/22 #30 tabs/ 3 refill Last Office Visit and Type: f/u on 11/06/22 Next Office Visit and Type: none scheduled

## 2023-05-16 ENCOUNTER — Other Ambulatory Visit: Payer: Self-pay | Admitting: Family Medicine

## 2023-06-04 ENCOUNTER — Other Ambulatory Visit: Payer: Self-pay | Admitting: Family Medicine

## 2023-06-04 MED ORDER — AMPHETAMINE-DEXTROAMPHETAMINE 10 MG PO TABS: ORAL_TABLET | ORAL | 0 refills | Status: DC

## 2023-06-04 NOTE — Telephone Encounter (Signed)
Prescription Request  06/04/2023  LOV: 11/06/2022  What is the name of the medication or equipment? amphetamine-dextroamphetamine (ADDERALL) 10 MG tablet   Have you contacted your pharmacy to request a refill? No   Which pharmacy would you like this sent to?  [id number]im [address] [place] [address] Phone: [phone] Fax: [phone]    Patient notified that their request is being sent to the clinical staff for review and that they should receive a response within 2 business days.   Please advise at Mobile [phone] (mobile)

## 2023-06-04 NOTE — Telephone Encounter (Signed)
Name of Medication: adderall Name of Pharmacy[facility]  Last Fill or Written Date and Quantity: 04/09/23 #60 tabs/ 0 refill Last Office Visit and Type: f/u on 11/06/22  Next Office Visit and Type: none scheduled

## 2023-07-09 ENCOUNTER — Ambulatory Visit: Payer: 59 | Admitting: Family Medicine

## 2023-07-09 ENCOUNTER — Encounter: Payer: Self-pay | Admitting: Family Medicine

## 2023-07-09 VITALS — BP 143/75 | HR 100 | Temp 99.0°F | Ht 67.0 in | Wt 252.2 lb

## 2023-07-09 DIAGNOSIS — R030 Elevated blood-pressure reading, without diagnosis of hypertension: Secondary | ICD-10-CM | POA: Diagnosis not present

## 2023-07-09 DIAGNOSIS — Z6839 Body mass index (BMI) 39.0-39.9, adult: Secondary | ICD-10-CM

## 2023-07-09 DIAGNOSIS — K2100 Gastro-esophageal reflux disease with esophagitis, without bleeding: Secondary | ICD-10-CM | POA: Diagnosis not present

## 2023-07-09 DIAGNOSIS — Z131 Encounter for screening for diabetes mellitus: Secondary | ICD-10-CM | POA: Insufficient documentation

## 2023-07-09 DIAGNOSIS — Z13220 Encounter for screening for lipoid disorders: Secondary | ICD-10-CM | POA: Insufficient documentation

## 2023-07-09 MED ORDER — TIRZEPATIDE-WEIGHT MANAGEMENT 2.5 MG/0.5ML ~~LOC~~ SOLN: 2.5000 mg | SUBCUTANEOUS | 0 refills | Status: AC

## 2023-07-09 NOTE — Patient Instructions (Addendum)
Keep exercising  Add some strength training to your routine, this is important for bone and brain health and can reduce your risk of falls and help your body use insulin properly and regulate weight  Light weights, exercise bands , and internet videos are a good way to start  Yoga (chair or regular), machines , floor exercises or a gym with machines are also good options     Try to get most of your carbohydrates from produce (with the exception of white potatoes) and whole grains Eat less bread/pasta/rice/snack foods/cereals/sweets and other items from the middle of the grocery store (processed carbs)  I sent prescription for zepbound to your pharmacy  It it gets approved try it  If any intolerable side effects let us know          Mediterranean Diet  Why follow it? Research shows. Those who follow the Mediterranean diet have a reduced risk of heart disease  The diet is associated with a reduced incidence of Parkinson's and Alzheimer's diseases People following the diet may have longer life expectancies and lower rates of chronic diseases  The Dietary Guidelines for Americans recommends the Mediterranean diet as an eating plan to promote health and prevent disease  What Is the Mediterranean Diet?  Healthy eating plan based on typical foods and recipes of Mediterranean-style cooking The diet is primarily a plant based diet; these foods should make up a majority of meals   Starches - Plant based foods should make up a majority of meals - They are an important sources of vitamins, minerals, energy, antioxidants, and fiber - Choose whole grains, foods high in fiber and minimally processed items  - Typical grain sources include wheat, oats, barley, corn, brown rice, bulgar, farro, millet, polenta, couscous  - Various types of beans include chickpeas, lentils, fava beans, black beans, white beans   Fruits  Veggies - Large quantities of antioxidant rich fruits & veggies; 6 or more servings   - Vegetables can be eaten raw or lightly drizzled with oil and cooked  - Vegetables common to the traditional Mediterranean Diet include: artichokes, arugula, beets, broccoli, brussel sprouts, cabbage, carrots, celery, collard greens, cucumbers, eggplant, kale, leeks, lemons, lettuce, mushrooms, okra, onions, peas, peppers, potatoes, pumpkin, radishes, rutabaga, shallots, spinach, sweet potatoes, turnips, zucchini - Fruits common to the Mediterranean Diet include: apples, apricots, avocados, cherries, clementines, dates, figs, grapefruits, grapes, melons, nectarines, oranges, peaches, pears, pomegranates, strawberries, tangerines  Fats - Replace butter and margarine with healthy oils, such as olive oil, canola oil, and tahini  - Limit nuts to no more than a handful a day  - Nuts include walnuts, almonds, pecans, pistachios, pine nuts  - Limit or avoid candied, honey roasted or heavily salted nuts - Olives are central to the [employer] - can be eaten whole or used in a variety of dishes   Meats Protein - Limiting red meat: no more than a few times a month - When eating red meat: choose lean cuts and keep the portion to the size of deck of cards - Eggs: approx. 0 to 4 times a week  - Fish and lean poultry: at least 2 a week  - Healthy protein sources include, chicken, [place], lean beef, lamb - Increase intake of seafood such as tuna, salmon, trout, mackerel, shrimp, scallops - Avoid or limit high fat processed meats such as sausage and bacon  Dairy - Include moderate amounts of low fat dairy products  - Focus on healthy dairy such as  fat free yogurt, skim milk, low or reduced fat cheese - Limit dairy products higher in fat such as whole or 2% milk, cheese, ice cream  Alcohol - Moderate amounts of red wine is ok  - No more than 5 oz daily for women (all ages) and men older than age 41  - No more than 10 oz of wine daily for men younger than 65  Other - Limit sweets and other desserts  -  Use herbs and spices instead of salt to flavor foods  - Herbs and spices common to the traditional Mediterranean Diet include: basil, bay leaves, chives, cloves, cumin, fennel, garlic, lavender, marjoram, mint, oregano, parsley, pepper, rosemary, sage, savory, sumac, tarragon, thyme   It's not just a diet, it's a lifestyle:  The Mediterranean diet includes lifestyle factors typical of those in the region  Foods, drinks and meals are best eaten with others and savored Daily physical activity is important for overall good health This could be strenuous exercise like running and aerobics This could also be more leisurely activities such as walking, housework, yard-work, or taking the stairs Moderation is the key; a balanced and healthy diet accommodates most foods and drinks Consider portion sizes and frequency of consumption of certain foods   Meal Ideas & Options:  Breakfast:  Whole wheat toast or whole wheat English muffins with peanut butter & hard boiled egg Steel cut oats topped with apples & cinnamon and skim milk  Fresh fruit: banana, strawberries, melon, berries, peaches  Smoothies: strawberries, bananas, greek yogurt, peanut butter Low fat greek yogurt with blueberries and granola  Egg white omelet with spinach and mushrooms Breakfast couscous: whole wheat couscous, apricots, skim milk, cranberries  Sandwiches:  Hummus and grilled vegetables (peppers, zucchini, squash) on whole wheat bread   Grilled chicken on whole wheat pita with lettuce, tomatoes, cucumbers or tzatziki  [place] salad on whole wheat bread: tuna salad made with greek yogurt, olives, red peppers, capers, green onions Garlic rosemary lamb pita: lamb sauted with garlic, rosemary, salt & pepper; add lettuce, cucumber, greek yogurt to pita - flavor with lemon juice and black pepper  Seafood:  Mediterranean grilled salmon, seasoned with garlic, basil, parsley, lemon juice and black pepper Shrimp, lemon, and spinach  whole-grain pasta salad made with low fat greek yogurt  Seared scallops with lemon orzo  Seared tuna steaks seasoned salt, pepper, coriander topped with tomato mixture of olives, tomatoes, olive oil, minced garlic, parsley, green onions and cappers  Meats:  Herbed greek chicken salad with kalamata olives, cucumber, feta  Red bell peppers stuffed with spinach, bulgur, lean ground beef (or lentils) & topped with feta   Kebabs: skewers of chicken, tomatoes, onions, zucchini, squash  [place] burgers: made with red onions, mint, dill, lemon juice, feta cheese topped with roasted red peppers Vegetarian Cucumber salad: cucumbers, artichoke hearts, celery, red onion, feta cheese, tossed in olive oil & lemon juice  Hummus and whole grain pita points with a greek salad (lettuce, tomato, feta, olives, cucumbers, red onion) Lentil soup with celery, carrots made with vegetable broth, garlic, salt and pepper  Tabouli salad: parsley, bulgur, mint, scallions, cucumbers, tomato, radishes, lemon juice, olive oil, salt and pepper.

## 2023-07-09 NOTE — Assessment & Plan Note (Signed)
Discussed how this problem influences overall health and the risks it imposes  Reviewed plan for weight loss with lower calorie diet (via better food choices (lower glycemic and portion control) along with exercise building up to or more than 30 minutes 5 days per week including some aerobic activity and strength training   No meds prior  Is on adderall  Has failed low carb diets  Co morbid condition-GERD and elevated blood pressure (likely HTN)  Has struggled despite good effort  Discussed opt of Zepbound if covered 2.5 mg weekly to titrate up as tol Disc option of GLP medication including possible side effects like GI intolerance and risk of thyroid and endocrine cancer, pancreatitis and gallstones, kidney problems and diabetic retinopathy  Will follow up when confirmed she can get this

## 2023-07-09 NOTE — Assessment & Plan Note (Signed)
-  A1c added to labs °

## 2023-07-09 NOTE — Progress Notes (Signed)
Subjective:    Patient ID: [name], female    DOB: 12-09-1980, 42 y.o.   MRN: [id number]  HPI  Wt Readings from Last 3 Encounters:  07/09/23 252 lb 4 oz (114.4 kg)  11/06/22 267 lb 8 oz (121.3 kg)  08/13/22 260 lb (117.9 kg)   39.51 kg/m  Vitals:   07/09/23 1442 07/09/23 1522  BP: (!) 144/78 (!) 143/75  Pulse: 100   Temp: 99 F (37.2 C)   SpO2: 98%     Pt presents to discuss weight loss treatment options   Bmi of 39.5  Exercises  Tried different eating plans and diet   She tried [employer] program at work  Used a tracker on phone (? How many calories)  Not an emotional eater  Does not over eat or under eat  Stopped soda   Occational skips dinner -busy Does not snack  Stress level is same as usual/ often high - used to it at this point  High strung person   Exercise  Minimum- walk/jogs 3 mi per day  Has a large dog - gets up  4:30 am and after dinner and loves it   some rowing and weight lifting with equipment (has at home)   Lots of fruits and veggies Lots of water   [employer] foods -occational  Pasta- none Rice -none  White potato - occational baked potato   Protein Chicken Some steak and ground beef (does sub ground [place])  Nuts -all types    Not a lot of fatty foods    Family history Mother -obese  GM- obese     Cannot seem to break that 250 lbs  Heaviest was 267    Found out zepbound is covered for weight loss    Takes adderall for ADD    Co morbid conditions GERD Blood pressure elevation    Lab Results  Component Value Date   TSH 4.36 06/07/2021   Lab Results  Component Value Date   NA 137 08/13/2022   K 3.6 08/13/2022   CO2 23 08/13/2022   GLUCOSE 90 08/13/2022   BUN 6 08/13/2022   CREATININE 0.80 08/13/2022   CALCIUM 8.7 (L) 08/13/2022   GFR 90.35 06/07/2021   GFRNONAA >60 08/13/2022   Lab Results  Component Value Date   ALT 12 08/13/2022   AST 12 (L) 08/13/2022   ALKPHOS 59 08/13/2022    BILITOT 1.5 (H) 08/13/2022    Lab Results  Component Value Date   WBC 11.5 (H) 08/13/2022   HGB 12.2 08/13/2022   HCT 37.5 08/13/2022   MCV 81.0 08/13/2022   PLT 264 08/13/2022        Patient Active Problem List   Diagnosis Date Noted   Diabetes mellitus screening 07/09/2023   Screening for lipoid disorders 07/09/2023   Vitamin D deficiency 03/08/2022   Elevated troponin 02/14/2022   Chest pain 02/13/2022   Routine general medical examination at a health care facility 06/07/2021   Current use of proton pump inhibitor 06/07/2021   Encounter for screening mammogram for breast cancer 06/07/2021   Vitamin B12 deficiency 06/07/2021   Elevated TSH 06/15/2020   Elevated BP without diagnosis of hypertension 05/31/2020   Encounter for screening for lipoid disorders 05/31/2020   Obesity, Class III, BMI 40-49.9 (morbid obesity) (HCC) 03/03/2020   Cubital tunnel syndrome on right 08/22/2018   GERD (gastroesophageal reflux disease) 01/01/2018   H/O migraine 10/11/2015   ADD (attention deficit disorder) 09/03/2014   Insomnia  09/02/2012   Past Medical History:  Diagnosis Date   ADD (attention deficit disorder)    Elevated BP without diagnosis of hypertension    Elevated TSH    GERD (gastroesophageal reflux disease)    Insomnia    Migraines    Past Surgical History:  Procedure Laterality Date   LEFT HEART CATH AND CORONARY ANGIOGRAPHY N/A 02/14/2022   Procedure: LEFT HEART CATH AND CORONARY ANGIOGRAPHY;  Surgeon: Yvonne Kendall, MD;  Location: [facility] INVASIVE CV [facility];  Service: Cardiovascular;  Laterality: N/A;   Social History   Tobacco Use   Smoking status: Never   Smokeless tobacco: Never  Substance Use Topics   Alcohol use: Yes    Alcohol/week: 0.0 standard drinks of alcohol    Comment: occasional    Drug use: No   Family History  Problem Relation Age of Onset   Healthy Mother    Cancer Father    No Known Allergies Current Outpatient Medications on File Prior to  Visit  Medication Sig Dispense Refill   amphetamine-dextroamphetamine (ADDERALL) 10 MG tablet Take one pill by mouth every am and one at lunchtime 60 tablet 0   Cholecalciferol (VITAMIN D3) 25 MCG (1000 UT) CAPS Take 2,000 Units by mouth daily.     levonorgestrel-ethinyl estradiol (ALTAVERA) 0.15-30 MG-MCG tablet TAKE 1 TABLET BY MOUTH EVERY DAY 84 tablet 2   omeprazole (PRILOSEC) 20 MG capsule TAKE 1 CAPSULE BY MOUTH EVERY DAY 90 capsule 1   vitamin B-12 (CYANOCOBALAMIN) 1000 MCG tablet Take 1,000 mcg by mouth daily.     zolpidem (AMBIEN) 10 MG tablet TAKE 1 TABLET (10 MG TOTAL) BY MOUTH AT BEDTIME AS NEEDED. FOR SLEEP 30 tablet 3   No current facility-administered medications on file prior to visit.    Review of Systems  Constitutional:  Positive for fatigue. Negative for activity change, appetite change, fever and unexpected weight change.  HENT:  Negative for congestion, ear pain, rhinorrhea, sinus pressure and sore throat.   Eyes:  Negative for pain, redness and visual disturbance.  Respiratory:  Negative for cough, shortness of breath and wheezing.   Cardiovascular:  Negative for chest pain and palpitations.  Gastrointestinal:  Negative for abdominal pain, blood in stool, constipation and diarrhea.  Endocrine: Negative for polydipsia and polyuria.  Genitourinary:  Negative for dysuria, frequency and urgency.  Musculoskeletal:  Negative for arthralgias, back pain and myalgias.  Skin:  Negative for pallor and rash.  Allergic/Immunologic: Negative for environmental allergies.  Neurological:  Negative for dizziness, syncope and headaches.  Hematological:  Negative for adenopathy. Does not bruise/bleed easily.  Psychiatric/Behavioral:  Negative for decreased concentration and dysphoric mood. The patient is not nervous/anxious.        Objective:   Physical Exam Constitutional:      General: She is not in acute distress.    Appearance: Normal appearance. She is well-developed. She is  obese. She is not ill-appearing or diaphoretic.  HENT:     Head: Normocephalic and atraumatic.  Eyes:     Conjunctiva/sclera: Conjunctivae normal.     Pupils: Pupils are equal, round, and reactive to light.  Neck:     Thyroid: No thyromegaly.     Vascular: No carotid bruit or JVD.  Cardiovascular:     Rate and Rhythm: Normal rate and regular rhythm.     Heart sounds: Normal heart sounds.     No gallop.  Pulmonary:     Effort: Pulmonary effort is normal. No respiratory distress.     Breath  sounds: Normal breath sounds. No wheezing or rales.  Abdominal:     General: There is no distension or abdominal bruit.     Palpations: Abdomen is soft.  Musculoskeletal:     Cervical back: Normal range of motion and neck supple.     Right lower leg: No edema.     Left lower leg: No edema.  Lymphadenopathy:     Cervical: No cervical adenopathy.  Skin:    General: Skin is warm and dry.     Coloration: Skin is not pale.     Findings: No rash.  Neurological:     Mental Status: She is alert.     Coordination: Coordination normal.     Deep Tendon Reflexes: Reflexes are normal and symmetric. Reflexes normal.  Psychiatric:        Mood and Affect: Mood normal.           Assessment & Plan:   Problem List Items Addressed This Visit       Digestive   GERD (gastroesophageal reflux disease)    Weight loss would help this Taking omeprazole 20 mg daily         Other   Screening for lipoid disorders   Obesity, Class III, BMI 40-49.9 (morbid obesity) (HCC) - Primary    Discussed how this problem influences overall health and the risks it imposes  Reviewed plan for weight loss with lower calorie diet (via better food choices (lower glycemic and portion control) along with exercise building up to or more than 30 minutes 5 days per week including some aerobic activity and strength training   No meds prior  Is on adderall  Has failed low carb diets  Co morbid condition-GERD and elevated  blood pressure (likely HTN)  Has struggled despite good effort  Discussed opt of Zepbound if covered 2.5 mg weekly to titrate up as tol Disc option of GLP medication including possible side effects like GI intolerance and risk of thyroid and endocrine cancer, pancreatitis and gallstones, kidney problems and diabetic retinopathy  Will follow up when confirmed she can get this       Relevant Medications   tirzepatide (ZEPBOUND) 2.5 MG/0.5ML injection vial   Elevated BP without diagnosis of hypertension     BP Readings from Last 1 Encounters:  07/09/23 (!) 143/75   Will work on lifestyle change and follow up  Weight loss is a goal  Most recent labs reviewed  Disc lifstyle change with low sodium diet and exercise        Relevant Orders   TSH   Lipid panel   Comprehensive metabolic panel   CBC with Differential/Platelet   Diabetes mellitus screening    A1c added to labs       Relevant Orders   Hemoglobin A1c

## 2023-07-09 NOTE — Assessment & Plan Note (Signed)
  BP Readings from Last 1 Encounters:  07/09/23 (!) 143/75   Will work on lifestyle change and follow up  Weight loss is a goal  Most recent labs reviewed  Disc lifstyle change with low sodium diet and exercise

## 2023-07-09 NOTE — Assessment & Plan Note (Signed)
Weight loss would help this Taking omeprazole 20 mg daily

## 2023-07-10 LAB — COMPREHENSIVE METABOLIC PANEL
ALT: 13 U/L (ref 0–35)
AST: 13 U/L (ref 0–37)
Albumin: 4 g/dL (ref 3.5–5.2)
Alkaline Phosphatase: 67 U/L (ref 39–117)
BUN: 10 mg/dL (ref 6–23)
CO2: 25 meq/L (ref 19–32)
Calcium: 9.1 mg/dL (ref 8.4–10.5)
Chloride: 105 meq/L (ref 96–112)
Creatinine, Ser: 0.83 mg/dL (ref 0.40–1.20)
GFR: 86.47 mL/min (ref >60.00)
Glucose, Bld: 87 mg/dL (ref 70–99)
Potassium: 4.4 meq/L (ref 3.5–5.1)
Sodium: 138 meq/L (ref 135–145)
Total Bilirubin: 0.4 mg/dL (ref 0.2–1.2)
Total Protein: 6.6 g/dL (ref 6.0–8.3)

## 2023-07-10 LAB — CBC WITH DIFFERENTIAL/PLATELET
Basophils Absolute: 0.1 10*3/uL (ref 0.0–0.1)
Basophils Relative: 0.7 % (ref 0.0–3.0)
Eosinophils Absolute: 0.2 10*3/uL (ref 0.0–0.7)
Eosinophils Relative: 2.1 % (ref 0.0–5.0)
HCT: 37.1 % (ref 36.0–46.0)
Hemoglobin: 12.2 g/dL (ref 12.0–15.0)
Lymphocytes Relative: 30.8 % (ref 12.0–46.0)
Lymphs Abs: 2.8 10*3/uL (ref 0.7–4.0)
MCHC: 32.9 g/dL (ref 30.0–36.0)
MCV: 81.5 fL (ref 78.0–100.0)
Monocytes Absolute: 0.6 10*3/uL (ref 0.1–1.0)
Monocytes Relative: 6.9 % (ref 3.0–12.0)
Neutro Abs: 5.4 10*3/uL (ref 1.4–7.7)
Neutrophils Relative %: 59.5 % (ref 43.0–77.0)
Platelets: 287 10*3/uL (ref 150.0–400.0)
RBC: 4.56 Mil/uL (ref 3.87–5.11)
RDW: 15.4 % (ref 11.5–15.5)
WBC: 9.1 10*3/uL (ref 4.0–10.5)

## 2023-07-10 LAB — LIPID PANEL
Cholesterol: 140 mg/dL (ref 0–200)
HDL: 46 mg/dL (ref >39.00)
LDL Cholesterol: 80 mg/dL (ref 0–99)
NonHDL: 94.47
Total CHOL/HDL Ratio: 3
Triglycerides: 74 mg/dL (ref 0.0–149.0)
VLDL: 14.8 mg/dL (ref 0.0–40.0)

## 2023-07-10 LAB — TSH: TSH: 2.76 u[IU]/mL (ref 0.35–5.50)

## 2023-07-10 LAB — HEMOGLOBIN A1C: Hgb A1c MFr Bld: 5.1 % (ref 4.6–6.5)

## 2023-07-27 ENCOUNTER — Other Ambulatory Visit: Payer: Self-pay | Admitting: Family Medicine

## 2023-07-27 MED ORDER — AMPHETAMINE-DEXTROAMPHETAMINE 10 MG PO TABS: ORAL_TABLET | ORAL | 0 refills | Status: DC

## 2023-07-27 NOTE — Telephone Encounter (Signed)
Prescription Request  07/27/2023  LOV: 07/09/2023  What is the name of the medication or equipment? amphetamine-dextroamphetamine (ADDERALL) 10 MG tablet   Have you contacted your pharmacy to request a refill? Yes   Which pharmacy would you like this sent to?  [id number] [address] [place] [address] Phone: [phone] Fax: [phone]    Patient notified that their request is being sent to the clinical staff for review and that they should receive a response within 2 business days.   Please advise at Mobile [phone] (mobile)

## 2023-07-27 NOTE — Telephone Encounter (Signed)
Name of Medication: adderall [facility]  Last Fill or Written Date and Quantity: 06/04/23 #60 tabs/ 0 refill Last Office Visit and Type:discuss weight on 07/09/23 Next Office Visit and Type: none scheduled

## 2023-08-16 ENCOUNTER — Other Ambulatory Visit: Payer: Self-pay | Admitting: Family Medicine

## 2023-08-17 NOTE — Telephone Encounter (Signed)
Name of Medication: Ambien Name of Pharm[facility] [address] Lavonia Dana or Written Date and Quantity: 04/18/23 #30 tabs/ 3 refill Last Office Visit and Type: Discuss weight on 07/09/23 Next Office Visit and Type: none scheduled

## 2023-09-18 ENCOUNTER — Encounter: Payer: Self-pay | Admitting: Family Medicine

## 2023-09-19 MED ORDER — TIRZEPATIDE-WEIGHT MANAGEMENT 2.5 MG/0.5ML ~~LOC~~ SOAJ: 2.5000 mg | SUBCUTANEOUS | 0 refills | Status: DC

## 2023-09-20 ENCOUNTER — Other Ambulatory Visit (HOSPITAL_COMMUNITY): Payer: Self-pay

## 2023-09-20 ENCOUNTER — Other Ambulatory Visit: Payer: Self-pay | Admitting: Family Medicine

## 2023-09-21 ENCOUNTER — Telehealth: Payer: Self-pay | Admitting: Pharmacist

## 2023-09-21 ENCOUNTER — Other Ambulatory Visit (HOSPITAL_COMMUNITY): Payer: Self-pay

## 2023-09-21 NOTE — Telephone Encounter (Signed)
Pharmacy Patient Advocate Encounter  Received notification from [facility] that Prior Authorization for ZEPBOUND 2.5MG /0.5ML PENS-INJECTORS has been APPROVED from 09/21/2023 to 03/20/2024. Unable to obtain price due to refill too soon rejection, last fill date 09/21/2023 next available fill date02/21/2025   PA #/Case ID/Reference #: [id number]

## 2023-09-21 NOTE — Telephone Encounter (Signed)
Pharmacy Patient Advocate Encounter   Received notification from Physician's Office that prior authorization for Zepbound 2.5MG /0.5ML pen-injectors is required/requested.   Insurance verification completed.   The patient is insured through [facility] .   Per test claim: PA required; PA submitted to above mentioned insurance via CoverMyMeds Key/confirmation #/EOC [id number] Status is pending

## 2023-09-26 ENCOUNTER — Other Ambulatory Visit: Payer: Self-pay | Admitting: Family Medicine

## 2023-09-26 MED ORDER — AMPHETAMINE-DEXTROAMPHETAMINE 10 MG PO TABS: ORAL_TABLET | ORAL | 0 refills | Status: DC

## 2023-09-26 NOTE — Telephone Encounter (Signed)
 Copied from CRM [id number]. Topic: Clinical - Medication Refill >> Sep 26, 2023 11:06 AM Pinkey ORN wrote: Most Recent Primary Care Visit:  Provider: RANDEEN HARDY A  Department: [facility]  Visit Type: OFFICE VISIT  Date: 07/09/2023  Medication: amphetamine -dextroamphetamine  (ADDERALL) 10 MG tablet  Has the patient contacted their pharmacy? Yes (Agent: If no, request that the patient contact the pharmacy for the refill. If patient does not wish to contact the pharmacy document the reason why and proceed with request.) (Agent: If yes, when and what did the pharmacy advise?)  Is this the correct pharmacy for this prescription? Yes If no, delete pharmacy and type the correct one.  This is the patient's preferred pharmacy:  [id number] - [address] [place] [address] Phone: [phone] Fax: [phone]   Has the prescription been filled recently? No  Is the patient out of the medication? Yes  Has the patient been seen for an appointment in the last year OR does the patient have an upcoming appointment? Yes  Can we respond through MyChart? Yes  Agent: Please be advised that Rx refills may take up to 3 business days. We ask that you follow-up with your pharmacy.

## 2023-09-26 NOTE — Telephone Encounter (Signed)
 Name of Medication: adderall Name of [facility]  Last Fill or Written Date and Quantity: 07/27/23 #60 tabs/ 0 refill Last Office Visit and Type:discuss weight on 07/09/23 Next Office Visit and Type: none scheduled

## 2023-10-15 ENCOUNTER — Other Ambulatory Visit: Payer: Self-pay | Admitting: Family Medicine

## 2023-10-15 MED ORDER — TIRZEPATIDE-WEIGHT MANAGEMENT 5 MG/0.5ML ~~LOC~~ SOLN: 5.0000 mg | SUBCUTANEOUS | 0 refills | Status: DC

## 2023-10-15 NOTE — Telephone Encounter (Signed)
 Pt's mychart message on Rx says:  I took my last 2 mg dose on yesterday, Sunday 23rd. I would like to move up to the next dose of 5 mg. Thank you!    Last OV was on 07/09/23 to discuss weight, last filled on 09/19/23 #2 mL/ 0 refills

## 2023-10-15 NOTE — Telephone Encounter (Signed)
 I did refill it    I also received a note about a possible interaction with her oral contraceptive They recommend using barrier (condom) for first month after increasing a dose while the gastric emptying time changes   Wanted to let her know  I was unaware of this until now

## 2023-10-16 ENCOUNTER — Other Ambulatory Visit: Payer: Self-pay | Admitting: Family Medicine

## 2023-10-16 NOTE — Telephone Encounter (Signed)
 Please ask if if she is ok with [facility] and then I will send, just want to get her ok first

## 2023-10-16 NOTE — Telephone Encounter (Signed)
 Please submit PA for increased dose of Zepbound 5mg .

## 2023-10-16 NOTE — Telephone Encounter (Signed)
 See note on Rx from pt it says:   Patient comment: [facility] saying that [facility] requires my prescriber to obtain insurance approval. This medication was approved last month by my insurance for 90 days. What do I or you guys need to process this through so I don't miss my dose over this issue. [facility] approved the medication for 90 days.    Looks like PA note says it was approved through [facility], local pharmacy may have given pt a tem supply of med (one time refill) and not isn't approving anymore. Please advise

## 2023-10-17 ENCOUNTER — Other Ambulatory Visit (HOSPITAL_BASED_OUTPATIENT_CLINIC_OR_DEPARTMENT_OTHER): Payer: Self-pay

## 2023-10-17 ENCOUNTER — Other Ambulatory Visit (HOSPITAL_COMMUNITY): Payer: Self-pay

## 2023-10-17 MED ORDER — TIRZEPATIDE-WEIGHT MANAGEMENT 5 MG/0.5ML ~~LOC~~ SOAJ
5.0000 mg | SUBCUTANEOUS | 0 refills | Status: DC
  Filled 2023-10-17 – 2023-10-18 (×3): qty 2, 28d supply, fill #0

## 2023-10-17 MED ORDER — TIRZEPATIDE-WEIGHT MANAGEMENT 5 MG/0.5ML ~~LOC~~ SOAJ: 5.0000 mg | SUBCUTANEOUS | 0 refills | Status: DC

## 2023-10-17 MED ORDER — TIRZEPATIDE-WEIGHT MANAGEMENT 5 MG/0.5ML ~~LOC~~ SOLN: 5.0000 mg | SUBCUTANEOUS | 0 refills | Status: DC

## 2023-10-17 NOTE — Telephone Encounter (Signed)
 Approval on fil is for zepbound 2.5mg  dose. Provider has INCREASED DOSE TO ZEPBOUND 5MG . Per Patient pharmacy has said a new PA is needed. Please submit for increased dose.

## 2023-10-17 NOTE — Telephone Encounter (Signed)
 Patient would like the Zepbound 5mg  PENS sent to [facility] pharmacy at [place]. Prescription previously sent as vials.

## 2023-10-18 ENCOUNTER — Other Ambulatory Visit (HOSPITAL_BASED_OUTPATIENT_CLINIC_OR_DEPARTMENT_OTHER): Payer: Self-pay

## 2023-10-18 MED ORDER — TIRZEPATIDE-WEIGHT MANAGEMENT 5 MG/0.5ML ~~LOC~~ SOAJ
5.0000 mg | SUBCUTANEOUS | 0 refills | Status: DC
  Filled 2023-10-18 (×2): qty 2, 28d supply, fill #0

## 2023-10-18 NOTE — Telephone Encounter (Signed)
 Resent Zepbound 5mg  pens to the pharmacy patient requested, [facility].  Dr[facility] Mil[facility] Antis [facility]

## 2023-10-18 NOTE — Addendum Note (Signed)
 Addended by: Lonia Blood on: 10/18/2023 07:43 AM   Modules accepted: Orders

## 2023-11-06 ENCOUNTER — Ambulatory Visit: Admitting: Podiatry

## 2023-11-07 ENCOUNTER — Other Ambulatory Visit: Payer: Self-pay | Admitting: Family Medicine

## 2023-11-07 MED ORDER — AMPHETAMINE-DEXTROAMPHETAMINE 10 MG PO TABS: ORAL_TABLET | ORAL | 0 refills | Status: DC

## 2023-11-07 NOTE — Telephone Encounter (Signed)
 Copied from CRM [id number]. Topic: Clinical - Medication Refill >> Nov 07, 2023  8:39 AM Drema Balzarine wrote: Most Recent Primary Care Visit:  Provider: Roxy Manns A  Department: [facility]  Visit Type: OFFICE VISIT  Date: 07/09/2023  Medication: ADDERALL,   Has the patient contacted their pharmacy? Yes (Agent: If no, request that the patient contact the pharmacy for the refill. If patient does not wish to contact the pharmacy document the reason why and proceed with request.) (Agent: If yes, when and what did the pharmacy advise?)  Is this the correct pharmacy for this prescription? Yes If no, delete pharmacy and type the correct one.  This is the patient's preferred pharmacy:   [id number] - [address] [place] [address] Phone: [phone] Fax: [phone]   Has the prescription been filled recently? Yes  Is the patient out of the medication? No, she has 1 pill left   Has the patient been seen for an appointment in the last year OR does the patient have an upcoming appointment? Yes  Can we respond through MyChart? Yes  Agent: Please be advised that Rx refills may take up to 3 business days. We ask that you follow-up with your pharmacy.

## 2023-11-07 NOTE — Telephone Encounter (Signed)
 Name of Medication: adderall [facility]  Last Fill or Written Date and Quantity: 09/26/23 #60 tabs/ 0 refill Last Office Visit and Type:discuss weight on 07/09/23 Next Office Visit and Type: none scheduled

## 2023-11-08 ENCOUNTER — Other Ambulatory Visit (HOSPITAL_BASED_OUTPATIENT_CLINIC_OR_DEPARTMENT_OTHER): Payer: Self-pay

## 2023-11-08 ENCOUNTER — Other Ambulatory Visit: Payer: Self-pay | Admitting: Family Medicine

## 2023-11-08 ENCOUNTER — Encounter: Payer: Self-pay | Admitting: Family Medicine

## 2023-11-08 ENCOUNTER — Telehealth: Payer: Self-pay | Admitting: *Deleted

## 2023-11-08 MED ORDER — ZEPBOUND 7.5 MG/0.5ML ~~LOC~~ SOAJ
7.5000 mg | SUBCUTANEOUS | 0 refills | Status: DC
  Filled 2023-11-08: qty 2, 28d supply, fill #0

## 2023-11-08 NOTE — Telephone Encounter (Signed)
 Pt sent a message saying:   I would like to refill my Zepbound PEN prescription and move from the 5mg  to the 7.5mg  as scheduled.    [facility] [address] [place]  Last OV to discuss weight was 06/29/23

## 2023-11-08 NOTE — Telephone Encounter (Signed)
 Sent!

## 2023-11-09 ENCOUNTER — Other Ambulatory Visit (HOSPITAL_BASED_OUTPATIENT_CLINIC_OR_DEPARTMENT_OTHER): Payer: Self-pay

## 2023-11-09 ENCOUNTER — Encounter (HOSPITAL_BASED_OUTPATIENT_CLINIC_OR_DEPARTMENT_OTHER): Payer: Self-pay

## 2023-11-09 NOTE — Telephone Encounter (Signed)
 Sent mychart letting pt know  ?

## 2023-12-10 ENCOUNTER — Encounter: Payer: Self-pay | Admitting: Family Medicine

## 2023-12-10 MED ORDER — TIRZEPATIDE-WEIGHT MANAGEMENT 10 MG/0.5ML ~~LOC~~ SOAJ
10.0000 mg | SUBCUTANEOUS | 0 refills | Status: DC
  Filled 2023-12-10: qty 2, 28d supply, fill #0

## 2023-12-11 ENCOUNTER — Other Ambulatory Visit (HOSPITAL_BASED_OUTPATIENT_CLINIC_OR_DEPARTMENT_OTHER): Payer: Self-pay

## 2023-12-12 ENCOUNTER — Other Ambulatory Visit: Payer: Self-pay | Admitting: Family Medicine

## 2023-12-12 ENCOUNTER — Encounter: Payer: Self-pay | Admitting: Family Medicine

## 2023-12-13 NOTE — Telephone Encounter (Signed)
 Name of Medication: Ambien  [facility] [address] Dianah Fort or Written Date and Quantity: 08/16/24 #30 tabs/ 3 refill Last Office Visit and Type: Discuss weight on 07/09/23 Next Office Visit and Type: none scheduled

## 2023-12-27 ENCOUNTER — Other Ambulatory Visit (HOSPITAL_BASED_OUTPATIENT_CLINIC_OR_DEPARTMENT_OTHER): Payer: Self-pay

## 2023-12-27 ENCOUNTER — Ambulatory Visit: Admitting: Family Medicine

## 2023-12-27 ENCOUNTER — Encounter: Payer: Self-pay | Admitting: Family Medicine

## 2023-12-27 VITALS — BP 128/78 | HR 77 | Temp 98.3°F | Ht 67.0 in | Wt 209.5 lb

## 2023-12-27 DIAGNOSIS — R7989 Other specified abnormal findings of blood chemistry: Secondary | ICD-10-CM

## 2023-12-27 DIAGNOSIS — E66811 Obesity, class 1: Secondary | ICD-10-CM | POA: Diagnosis not present

## 2023-12-27 DIAGNOSIS — E538 Deficiency of other specified B group vitamins: Secondary | ICD-10-CM | POA: Diagnosis not present

## 2023-12-27 DIAGNOSIS — E559 Vitamin D deficiency, unspecified: Secondary | ICD-10-CM

## 2023-12-27 DIAGNOSIS — R030 Elevated blood-pressure reading, without diagnosis of hypertension: Secondary | ICD-10-CM | POA: Diagnosis not present

## 2023-12-27 DIAGNOSIS — F988 Other specified behavioral and emotional disorders with onset usually occurring in childhood and adolescence: Secondary | ICD-10-CM

## 2023-12-27 DIAGNOSIS — E66813 Obesity, class 3: Secondary | ICD-10-CM

## 2023-12-27 DIAGNOSIS — K2100 Gastro-esophageal reflux disease with esophagitis, without bleeding: Secondary | ICD-10-CM | POA: Diagnosis not present

## 2023-12-27 DIAGNOSIS — F900 Attention-deficit hyperactivity disorder, predominantly inattentive type: Secondary | ICD-10-CM

## 2023-12-27 DIAGNOSIS — Z6832 Body mass index (BMI) 32.0-32.9, adult: Secondary | ICD-10-CM

## 2023-12-27 DIAGNOSIS — Z1231 Encounter for screening mammogram for malignant neoplasm of breast: Secondary | ICD-10-CM

## 2023-12-27 DIAGNOSIS — E6609 Other obesity due to excess calories: Secondary | ICD-10-CM

## 2023-12-27 MED ORDER — ZEPBOUND 12.5 MG/0.5ML ~~LOC~~ SOAJ
12.5000 mg | SUBCUTANEOUS | 0 refills | Status: DC
  Filled 2023-12-27 – 2024-01-07 (×2): qty 2, 28d supply, fill #0

## 2023-12-27 NOTE — Assessment & Plan Note (Signed)
 Is overdue for first screening mammogram Sees gyn for exams  Mammo ordered Pt will call [place] to schedule

## 2023-12-27 NOTE — Assessment & Plan Note (Signed)
 Continues to do well with adderall 10 mg  No problems  Performing well at work

## 2023-12-27 NOTE — Assessment & Plan Note (Signed)
 Has lost significant weight - almost 60 lb since last march  Before and them on zepbound  which works well  Encouraged to continue muscle building exercise (is [place] after right shoulder surgery currently)  Will increase from 10 to 12.5 mg weekly  Tolerating well  Good food choices

## 2023-12-27 NOTE — Assessment & Plan Note (Signed)
 Lab Results  Component Value Date   VITAMINB12 383 03/08/2022   Oral supplementation

## 2023-12-27 NOTE — Assessment & Plan Note (Signed)
 With weight loss, blood pressure is better

## 2023-12-27 NOTE — Assessment & Plan Note (Signed)
 Last vitamin D  Lab Results  Component Value Date   VD25OH 34.40 03/08/2022   Oral supplementation  On ppi

## 2023-12-27 NOTE — Patient Instructions (Addendum)
 Add some strength training to your routine, this is important for bone and brain health and can reduce your risk of falls and help your body use insulin properly and regulate weight  Light weights, exercise bands , and internet videos are a good way to start  Yoga (chair or regular), machines , floor exercises or a gym with machines are also good options    When you finish the 10 mg zepbound -go up to 12.5    Try to get most of your carbohydrates from produce (with the exception of white potatoes) and whole grains Eat less bread/pasta/rice/snack foods/cereals/sweets and other items from the middle of the grocery store (processed carbs)   Get your mammogram   You have an order for:  [x]   3D Mammogram  []   Bone Density     Please call for appointment:   [x]   [facility]  [address] [place] [address]  [phone]  []   [facility])   [address]  [place], [place] [address]  [phone]  []   [facility]      [address] [place], [place]        [phone]         []   [facility]  [address] [place], [place]  [phone]  []  [facility]   [address]   [place], [place] [address]  [phone]  []  [facility]  [address] [place], [place] [address] [phone]    Make sure to wear two piece clothing  No lotions powders or deodorants the day of the appointment Make sure to bring picture ID and insurance card.  Bring list of medications you are currently taking including any supplements.   Schedule your screening mammogram through MyChart!   Select [facility] imaging sites can now be scheduled through MyChart.  Log into your MyChart account.  Go to 'Visit' (or 'Appointments' if  on mobile App)  --> Schedule an  Appointment  Under 'Select a Reason for Visit' choose the Mammogram  Screening option.  Complete the pre-visit questions  and select the time and place that  best fits your schedule

## 2023-12-27 NOTE — Assessment & Plan Note (Signed)
 Continues to take omeprazole  20 mg daily  Watching B12 and D levels   As she continues to loose weight /will discuss plan to wean this [name] will not need long term  Encouraged to avoid triggering foods

## 2023-12-27 NOTE — Progress Notes (Signed)
 Subjective:    Patient ID: [name], female    DOB: 08/14/1981, 43 y.o.   MRN: [id number]  HPI  Wt Readings from Last 3 Encounters:  12/27/23 209 lb 8 oz (95 kg)  07/09/23 252 lb 4 oz (114.4 kg)  11/06/22 267 lb 8 oz (121.3 kg)   32.81 kg/m  Vitals:   12/27/23 0759  BP: 128/78  Pulse: 77  Temp: 98.3 F (36.8 C)  SpO2: 100%    Pt presents for follow up of obesity treatment and other chronic health problems   Lost over 60 lb since 3 /2024    Taking zepbound  10 mg weekly  No problems tolerating it   Was doing strength training  Had shoulder surgery in march -had to stop / rotator cuff and bicep repair Now taking brace off for some short periods of time  Doing PT the whole time  Will start some resistance training in right arm next week    Does chair routines at work  Other parts of the body   Nutrition  Is choosing better foods - avoiding fried foods and pasta  (does not crave it or want it)  Eats small amounts   Lots of fruit   Is interested in going up on the dose   Still doing well with adderall    Takes ambien  prn for sleep   GERD Still takes omeprazole    Needs first mammogram     Patient Active Problem List   Diagnosis Date Noted   Diabetes mellitus screening 07/09/2023   Screening for lipoid disorders 07/09/2023   Vitamin D  deficiency 03/08/2022   Elevated troponin 02/14/2022   Routine general medical examination at a health care facility 06/07/2021   Current use of proton pump inhibitor 06/07/2021   Encounter for screening mammogram for breast cancer 06/07/2021   Vitamin B12 deficiency 06/07/2021   Encounter for screening for lipoid disorders 05/31/2020   Class 1 obesity due to excess calories with body mass index (BMI) of 32.0 to 32.9 in adult 03/03/2020   Cubital tunnel syndrome on right 08/22/2018   GERD (gastroesophageal reflux disease) 01/01/2018   H/O migraine 10/11/2015   ADD (attention deficit disorder) 09/03/2014    Insomnia 09/02/2012   Past Medical History:  Diagnosis Date   ADD (attention deficit disorder)    Elevated BP without diagnosis of hypertension    Elevated TSH    GERD (gastroesophageal reflux disease)    Insomnia    Migraines    Past Surgical History:  Procedure Laterality Date   LEFT HEART CATH AND CORONARY ANGIOGRAPHY N/A 02/14/2022   Procedure: LEFT HEART CATH AND CORONARY ANGIOGRAPHY;  Surgeon: Sammy Crisp, MD;  Location: [facility] INVASIVE CV [facility];  Service: Cardiovascular;  Laterality: N/A;   Social History   Tobacco Use   Smoking status: Never   Smokeless tobacco: Never  Substance Use Topics   Alcohol use: Yes    Alcohol/week: 0.0 standard drinks of alcohol    Comment: occasional    Drug use: No   Family History  Problem Relation Age of Onset   Healthy Mother    Cancer Father    No Known Allergies Current Outpatient Medications on File Prior to Visit  Medication Sig Dispense Refill   amphetamine -dextroamphetamine  (ADDERALL) 10 MG tablet Take one pill by mouth every am and one at lunchtime 60 tablet 0   levonorgestrel -ethinyl estradiol (ALTAVERA) 0.15-30 MG-MCG tablet TAKE 1 TABLET BY MOUTH EVERY DAY 28 tablet 3   omeprazole  (PRILOSEC) 20  MG capsule TAKE 1 CAPSULE BY MOUTH EVERY DAY 90 capsule 1   tirzepatide  (ZEPBOUND ) 10 MG/0.5ML Pen Inject 10 mg into the skin once a week. 2 mL 0   zolpidem  (AMBIEN ) 10 MG tablet TAKE 1 TABLET (10 MG TOTAL) BY MOUTH AT BEDTIME AS NEEDED FOR SLEEP 30 tablet 3   No current facility-administered medications on file prior to visit.    Review of Systems  Constitutional:  Negative for activity change, appetite change, fatigue, fever and unexpected weight change.  HENT:  Negative for congestion, ear pain, rhinorrhea, sinus pressure and sore throat.   Eyes:  Negative for pain, redness and visual disturbance.  Respiratory:  Negative for cough, shortness of breath and wheezing.   Cardiovascular:  Negative for chest pain and palpitations.   Gastrointestinal:  Negative for abdominal pain, blood in stool, constipation and diarrhea.  Endocrine: Negative for polydipsia and polyuria.  Genitourinary:  Negative for dysuria, frequency and urgency.  Musculoskeletal:  Negative for arthralgias, back pain and myalgias.       Some post operative shoulder pain right  Getting better and stronger   Skin:  Negative for pallor and rash.  Allergic/Immunologic: Negative for environmental allergies.  Neurological:  Negative for dizziness, syncope and headaches.  Hematological:  Negative for adenopathy. Does not bruise/bleed easily.  Psychiatric/Behavioral:  Negative for decreased concentration and dysphoric mood. The patient is not nervous/anxious.        Objective:   Physical Exam Constitutional:      General: She is not in acute distress.    Appearance: Normal appearance. She is well-developed. She is obese. She is not ill-appearing or diaphoretic.  HENT:     Head: Normocephalic and atraumatic.  Eyes:     Conjunctiva/sclera: Conjunctivae normal.     Pupils: Pupils are equal, round, and reactive to light.  Neck:     Thyroid: No thyromegaly.     Vascular: No carotid bruit or JVD.  Cardiovascular:     Rate and Rhythm: Normal rate and regular rhythm.     Heart sounds: Normal heart sounds.     No gallop.  Pulmonary:     Effort: Pulmonary effort is normal. No respiratory distress.     Breath sounds: Normal breath sounds. No wheezing or rales.  Abdominal:     General: There is no distension or abdominal bruit.     Palpations: Abdomen is soft.  Musculoskeletal:     Cervical back: Normal range of motion and neck supple.     Right lower leg: No edema.     Left lower leg: No edema.     Comments: Limited rom of right shoulder   Lymphadenopathy:     Cervical: No cervical adenopathy.  Skin:    General: Skin is warm and dry.     Coloration: Skin is not pale.     Findings: No rash.  Neurological:     Mental Status: She is alert.      Coordination: Coordination normal.     Deep Tendon Reflexes: Reflexes are normal and symmetric. Reflexes normal.  Psychiatric:        Attention and Perception: Attention normal.        Mood and Affect: Mood normal.        Cognition and Memory: Cognition and memory normal.           Assessment & Plan:   Problem List Items Addressed This Visit       Digestive   GERD (gastroesophageal reflux disease)  Continues to take omeprazole  20 mg daily  Watching B12 and D levels   As she continues to loose weight /will discuss plan to wean this Blima [place] will not need long term  Encouraged to avoid triggering foods         Other   Vitamin D  deficiency   Last vitamin D  Lab Results  Component Value Date   VD25OH 34.40 03/08/2022   Oral supplementation  On ppi      Vitamin B12 deficiency   Lab Results  Component Value Date   VITAMINB12 383 03/08/2022   Oral supplementation       Encounter for screening mammogram for breast cancer   Is overdue for first screening mammogram Sees gyn for exams  Mammo ordered Pt will call [place] to schedule       Relevant Orders   MM 3D SCREENING MAMMOGRAM BILATERAL BREAST   RESOLVED: Elevated TSH   Lab Results  Component Value Date   TSH 2.76 07/09/2023   Last time/normal range       RESOLVED: Elevated BP without diagnosis of hypertension   With weight loss, blood pressure is better      Class 1 obesity due to excess calories with body mass index (BMI) of 32.0 to 32.9 in adult - Primary   Has lost significant weight - almost 60 lb since last march  Before and them on zepbound  which works well  Encouraged to continue muscle building exercise (is [place] after right shoulder surgery currently)  Will increase from 10 to 12.5 mg weekly  Tolerating well  Good food choices         Relevant Medications   tirzepatide  (ZEPBOUND ) 12.5 MG/0.5ML Pen   ADD (attention deficit disorder)   Continues to do well with adderall 10 mg   No problems  Performing well at work

## 2023-12-27 NOTE — Assessment & Plan Note (Signed)
 Lab Results  Component Value Date   TSH 2.76 07/09/2023   Last time/normal range

## 2024-01-07 ENCOUNTER — Other Ambulatory Visit (HOSPITAL_BASED_OUTPATIENT_CLINIC_OR_DEPARTMENT_OTHER): Payer: Self-pay

## 2024-01-10 ENCOUNTER — Other Ambulatory Visit: Payer: Self-pay | Admitting: Family Medicine

## 2024-01-10 MED ORDER — AMPHETAMINE-DEXTROAMPHETAMINE 10 MG PO TABS: ORAL_TABLET | ORAL | 0 refills | Status: DC

## 2024-01-10 NOTE — Telephone Encounter (Signed)
 Copied from CRM [id number]. Topic: Clinical - Medication Refill >> Jan 10, 2024  8:19 AM Adonis Hoot wrote: Medication: amphetamine -dextroamphetamine  (ADDERALL) 10 MG tablet  Has the patient contacted their pharmacy? Yes (Agent: If no, request that the patient contact the pharmacy for the refill. If patient does not wish to contact the pharmacy document the reason why and proceed with request.) (Agent: If yes, when and what did the pharmacy advise?)  This is the patient's preferred pharmacy:  [id number] [place] [facility] - [address] [place] [address] Phone: [phone] Fax: [phone]   Is this the correct pharmacy for this prescription? Yes If no, delete pharmacy and type the correct one.   Has the prescription been filled recently? No  Is the patient out of the medication? Yes  Has the patient been seen for an appointment in the last year OR does the patient have an upcoming appointment? Yes  Can we respond through MyChart? Yes  Agent: Please be advised that Rx refills may take up to 3 business days. We ask that you follow-up with your pharmacy.

## 2024-01-10 NOTE — Telephone Encounter (Signed)
 Name of Medication: adderall Name of Pharmacy: [facility]  Last Fill or Written Date and Quantity: 11/07/23 #60 tabs/ 0 refill Last Office Visit and Type: f/u 12/27/23 Next Office Visit and Type: none scheduled

## 2024-02-04 ENCOUNTER — Encounter: Payer: Self-pay | Admitting: Family Medicine

## 2024-02-04 ENCOUNTER — Other Ambulatory Visit: Payer: Self-pay | Admitting: Family Medicine

## 2024-02-04 ENCOUNTER — Other Ambulatory Visit (HOSPITAL_BASED_OUTPATIENT_CLINIC_OR_DEPARTMENT_OTHER): Payer: Self-pay

## 2024-02-04 MED ORDER — ZEPBOUND 12.5 MG/0.5ML ~~LOC~~ SOAJ
12.5000 mg | SUBCUTANEOUS | 3 refills | Status: DC
  Filled 2024-02-04: qty 2, 28d supply, fill #0
  Filled 2024-02-29: qty 2, 28d supply, fill #1

## 2024-02-04 NOTE — Telephone Encounter (Signed)
 Last OV was a f/u on 12/27/23 Last filled on 12/27/23 # 2 mL/ 0 refills  Pt's message on her mychart request says:  I would like to stay on the 12.5mg  PEN for another month. Thank you

## 2024-02-05 ENCOUNTER — Other Ambulatory Visit (HOSPITAL_BASED_OUTPATIENT_CLINIC_OR_DEPARTMENT_OTHER): Payer: Self-pay

## 2024-02-05 ENCOUNTER — Encounter (HOSPITAL_BASED_OUTPATIENT_CLINIC_OR_DEPARTMENT_OTHER): Payer: Self-pay

## 2024-02-20 ENCOUNTER — Ambulatory Visit: Admitting: Family Medicine

## 2024-02-20 ENCOUNTER — Other Ambulatory Visit (HOSPITAL_COMMUNITY): Payer: Self-pay

## 2024-03-06 ENCOUNTER — Encounter: Payer: Self-pay | Admitting: Family Medicine

## 2024-03-06 ENCOUNTER — Ambulatory Visit: Admitting: Family Medicine

## 2024-03-06 ENCOUNTER — Ambulatory Visit: Payer: Self-pay | Admitting: Family Medicine

## 2024-03-06 VITALS — BP 108/66 | HR 77 | Temp 98.1°F | Ht 67.0 in | Wt 184.4 lb

## 2024-03-06 DIAGNOSIS — L989 Disorder of the skin and subcutaneous tissue, unspecified: Secondary | ICD-10-CM | POA: Diagnosis not present

## 2024-03-06 LAB — C-REACTIVE PROTEIN: CRP: 1.2 mg/dL (ref 0.5–20.0)

## 2024-03-06 LAB — CBC WITH DIFFERENTIAL/PLATELET
Basophils Absolute: 0 K/uL (ref 0.0–0.1)
Basophils Relative: 0.5 % (ref 0.0–3.0)
Eosinophils Absolute: 0.1 K/uL (ref 0.0–0.7)
Eosinophils Relative: 1.9 % (ref 0.0–5.0)
HCT: 37 % (ref 36.0–46.0)
Hemoglobin: 12.5 g/dL (ref 12.0–15.0)
Lymphocytes Relative: 27.8 % (ref 12.0–46.0)
Lymphs Abs: 1.7 K/uL (ref 0.7–4.0)
MCHC: 33.8 g/dL (ref 30.0–36.0)
MCV: 83.5 fl (ref 78.0–100.0)
Monocytes Absolute: 0.4 K/uL (ref 0.1–1.0)
Monocytes Relative: 6 % (ref 3.0–12.0)
Neutro Abs: 3.9 K/uL (ref 1.4–7.7)
Neutrophils Relative %: 63.8 % (ref 43.0–77.0)
Platelets: 237 K/uL (ref 150.0–400.0)
RBC: 4.43 Mil/uL (ref 3.87–5.11)
RDW: 14.9 % (ref 11.5–15.5)
WBC: 6.2 K/uL (ref 4.0–10.5)

## 2024-03-06 LAB — SEDIMENTATION RATE: Sed Rate: 4 mm/h (ref 0–20)

## 2024-03-06 MED ORDER — CLOBETASOL PROPIONATE 0.05 % EX OINT: 1.0000 | TOPICAL_OINTMENT | Freq: Two times a day (BID) | CUTANEOUS | 0 refills | Status: AC | PRN

## 2024-03-06 NOTE — Patient Instructions (Addendum)
 Let's get labs today  Keep using the clobetasol  for now    Continue using the clobetasol  ointment   Avoid scented products/ harsh detergents and hot water   I will work on referral to dermatology at a tertiary care center [facility] or [facility])   Please let us  know if you don't hear in 1-2 weeks to set that up

## 2024-03-06 NOTE — Progress Notes (Signed)
 Subjective:    Patient ID: [name], female    DOB: 08/02/1981, 43 y.o.   MRN: [id number]  HPI  Wt Readings from Last 3 Encounters:  03/06/24 184 lb 6 oz (83.6 kg)  12/27/23 209 lb 8 oz (95 kg)  07/09/23 252 lb 4 oz (114.4 kg)   28.88 kg/m  Vitals:   03/06/24 0800  BP: 108/66  Pulse: 77  Temp: 98.1 F (36.7 C)  SpO2: 99%    Pt presents with c/o   Insect bites- mosquitos   Try to avoid them  Has insect spray all over   Bites start with red raised bump , then forms a blister a few days later  Then center flattens out (looks like ringworm)  Then will peel     Well over 10 years of reactions to insect bites Saw derm years ago  Clobetasol  0.05% - ointment- helps some but not great (then covers with tegaderm and it does not stay on long)   Did have a biopsy - no real clarification from that   Has tried to get in with another derm    Bites leave hyperpigmented area after   A bite can leave swelling on an area for a long time   No history of anaphylaxis or angio edema   Similar inflammation when a mole was removed on left collar bone   Wondered about sweets syndrome              Patient Active Problem List   Diagnosis Date Noted   Skin lesion 03/06/2024   Diabetes mellitus screening 07/09/2023   Screening for lipoid disorders 07/09/2023   Vitamin D  deficiency 03/08/2022   Elevated troponin 02/14/2022   Routine general medical examination at a health care facility 06/07/2021   Current use of proton pump inhibitor 06/07/2021   Encounter for screening mammogram for breast cancer 06/07/2021   Vitamin B12 deficiency 06/07/2021   Encounter for screening for lipoid disorders 05/31/2020   Class 1 obesity due to excess calories with body mass index (BMI) of 32.0 to 32.9 in adult 03/03/2020   Cubital tunnel syndrome on right 08/22/2018   GERD (gastroesophageal reflux disease) 01/01/2018   H/O migraine 10/11/2015   ADD (attention deficit disorder)  09/03/2014   Insomnia 09/02/2012   Past Medical History:  Diagnosis Date   ADD (attention deficit disorder)    Elevated BP without diagnosis of hypertension    Elevated TSH    GERD (gastroesophageal reflux disease)    Insomnia    Migraines    Past Surgical History:  Procedure Laterality Date   LEFT HEART CATH AND CORONARY ANGIOGRAPHY N/A 02/14/2022   Procedure: LEFT HEART CATH AND CORONARY ANGIOGRAPHY;  Surgeon: Mady Bruckner, MD;  Location: [facility];  Service: Cardiovascular;  Laterality: N/A;   Social History   Tobacco Use   Smoking status: Never   Smokeless tobacco: Never  Substance Use Topics   Alcohol use: Yes    Alcohol/week: 0.0 standard drinks of alcohol    Comment: occasional    Drug use: No   Family History  Problem Relation Age of Onset   Healthy Mother    Cancer Father    No Known Allergies Current Outpatient Medications on File Prior to Visit  Medication Sig Dispense Refill   amphetamine -dextroamphetamine  (ADDERALL) 10 MG tablet Take one pill by mouth every am and one at lunchtime 60 tablet 0   levonorgestrel -ethinyl estradiol (ALTAVERA) 0.15-30 MG-MCG tablet TAKE 1 TABLET BY MOUTH  EVERY DAY 28 tablet 3   omeprazole  (PRILOSEC) 20 MG capsule TAKE 1 CAPSULE BY MOUTH EVERY DAY 90 capsule 1   tirzepatide  (ZEPBOUND ) 12.5 MG/0.5ML Pen Inject 12.5 mg into the skin once a week. 2 mL 3   zolpidem  (AMBIEN ) 10 MG tablet TAKE 1 TABLET (10 MG TOTAL) BY MOUTH AT BEDTIME AS NEEDED FOR SLEEP 30 tablet 3   tirzepatide  (ZEPBOUND ) 10 MG/0.5ML Pen Inject 10 mg into the skin once a week. 2 mL 0   No current facility-administered medications on file prior to visit.    Review of Systems  Constitutional:  Negative for activity change, appetite change, fatigue, fever and unexpected weight change.  HENT:  Negative for congestion, ear pain, rhinorrhea, sinus pressure and sore throat.   Eyes:  Negative for pain, redness and visual disturbance.  Respiratory:  Negative  for cough, shortness of breath and wheezing.   Cardiovascular:  Negative for chest pain and palpitations.  Gastrointestinal:  Negative for abdominal pain, blood in stool, constipation and diarrhea.  Endocrine: Negative for polydipsia and polyuria.  Genitourinary:  Negative for dysuria, frequency and urgency.  Musculoskeletal:  Negative for arthralgias, back pain and myalgias.  Skin:  Negative for pallor and rash.       Skin lesions  Allergic/Immunologic: Negative for environmental allergies.  Neurological:  Negative for dizziness, syncope and headaches.  Hematological:  Negative for adenopathy. Does not bruise/bleed easily.  Psychiatric/Behavioral:  Negative for decreased concentration and dysphoric mood. The patient is not nervous/anxious.        Objective:   Physical Exam Constitutional:      General: She is not in acute distress.    Appearance: Normal appearance. She is normal weight. She is not ill-appearing.  HENT:     Head: Normocephalic and atraumatic.     Mouth/Throat:     Mouth: Mucous membranes are moist.  Eyes:     General: No scleral icterus.       Right eye: No discharge.        Left eye: No discharge.     Conjunctiva/sclera: Conjunctivae normal.     Pupils: Pupils are equal, round, and reactive to light.  Cardiovascular:     Rate and Rhythm: Normal rate and regular rhythm.  Pulmonary:     Effort: Pulmonary effort is normal. No respiratory distress.  Musculoskeletal:     Cervical back: Neck supple.     Right lower leg: No edema.     Left lower leg: No edema.  Lymphadenopathy:     Cervical: No cervical adenopathy.  Skin:    General: Skin is warm and dry.     Coloration: Skin is not jaundiced or pale.     Findings: Lesion present.     Comments: Skin lesions   Mid /upper back  Patch of erythematous plaque with some vesicular change on the edges Not tender  Left medial ankle  2 confluent round areas of erythema with some vesicular change in periphery and  central clearing  Non tender No scale or open area  Some areas of hyperpigmentation on arms/legs from previous lesions   Neurological:     Mental Status: She is alert.     Sensory: No sensory deficit.  Psychiatric:        Mood and Affect: Mood normal.           Assessment & Plan:   Problem List Items Addressed This Visit       Musculoskeletal and Integument   Skin lesion -  Primary   Ongoing areas of erythema/ with vesicular borders and sometimes central clearing  Chronic and worsening  Recurrent  Trunk and extremities Usually in response to trauma or insect bite  Treated with clobetasol  oint (some improvent) No results form past bx years ago No constitutional symptoms (pt is worried about sweets syndrome)  Esr/crp and cbc today  Desires referral to tertiary care center for dermatology  Referral done Will continue topical treatment and avoidance of chemicals/heat and triggers if possible   Call back and Er precautions noted in detail today        Relevant Orders   CBC with Differential/Platelet (Completed)   Sedimentation Rate (Completed)   C-reactive protein (Completed)   Ambulatory referral to Dermatology

## 2024-03-06 NOTE — Assessment & Plan Note (Addendum)
 Ongoing areas of erythema/ with vesicular borders and sometimes central clearing  Chronic and worsening  Recurrent  Trunk and extremities Usually in response to trauma or insect bite  Treated with clobetasol  oint (some improvent) No results form past bx years ago No constitutional symptoms (pt is worried about sweets syndrome)  Esr/crp and cbc today  Desires referral to tertiary care center for dermatology  Referral done Will continue topical treatment and avoidance of chemicals/heat and triggers if possible   Call back and Er precautions noted in detail today

## 2024-03-07 ENCOUNTER — Encounter: Payer: Self-pay | Admitting: *Deleted

## 2024-03-10 ENCOUNTER — Telehealth: Payer: Self-pay | Admitting: Family Medicine

## 2024-03-10 MED ORDER — AMPHETAMINE-DEXTROAMPHETAMINE 10 MG PO TABS: ORAL_TABLET | ORAL | 0 refills | Status: DC

## 2024-03-10 NOTE — Telephone Encounter (Signed)
 Copied from CRM [id number]. Topic: Clinical - Medication Refill >> Mar 10, 2024  9:40 AM Gennette ORN wrote: Medication: amphetamine -dextroamphetamine  (ADDERALL) 10 MG tablet   Has the patient contacted their pharmacy? Yes (Agent: If no, request that the patient contact the pharmacy for the refill. If patient does not wish to contact the pharmacy document the reason why and proceed with request.) (Agent: If yes, when and what did the pharmacy advise?)  This is the patient's preferred pharmacy:  [id number] - [address] [place] [address] Phone: [phone] Fax: [phone]   Is this the correct pharmacy for this prescription? Yes If no, delete pharmacy and type the correct one.   Has the prescription been filled recently? Yes  Is the patient out of the medication? Yes  Has the patient been seen for an appointment in the last year OR does the patient have an upcoming appointment? Yes  Can we respond through MyChart? Yes  Agent: Please be advised that Rx refills may take up to 3 business days. We ask that you follow-up with your pharmacy.

## 2024-03-10 NOTE — Telephone Encounter (Signed)
 sent

## 2024-03-10 NOTE — Telephone Encounter (Signed)
 Name of Medication: adderall Name of [facility]  Last Fill or Written Date and Quantity: 01/10/24 #60 tabs/ 0 refill Last Office Visit and Type: Discuss Bug bites Next Office Visit and Type: none scheduled

## 2024-03-11 ENCOUNTER — Other Ambulatory Visit: Payer: Self-pay | Admitting: Family Medicine

## 2024-03-25 ENCOUNTER — Encounter: Payer: Self-pay | Admitting: Family Medicine

## 2024-03-25 ENCOUNTER — Ambulatory Visit
Admission: RE | Admit: 2024-03-25 | Discharge: 2024-03-25 | Disposition: A | Discharge status: Home / Self Care | Source: Ambulatory Visit | Attending: Family Medicine | Admitting: Family Medicine

## 2024-03-25 DIAGNOSIS — Z1231 Encounter for screening mammogram for malignant neoplasm of breast: Secondary | ICD-10-CM | POA: Diagnosis present

## 2024-03-25 MED ORDER — TIRZEPATIDE-WEIGHT MANAGEMENT 15 MG/0.5ML ~~LOC~~ SOAJ
15.0000 mg | SUBCUTANEOUS | 2 refills | Status: DC
  Filled 2024-03-25: qty 2, 28d supply, fill #0
  Filled 2024-04-20: qty 2, 28d supply, fill #1
  Filled 2024-05-13: qty 2, 28d supply, fill #2

## 2024-03-26 ENCOUNTER — Other Ambulatory Visit (HOSPITAL_COMMUNITY): Payer: Self-pay

## 2024-03-26 ENCOUNTER — Encounter (HOSPITAL_BASED_OUTPATIENT_CLINIC_OR_DEPARTMENT_OTHER): Payer: Self-pay

## 2024-03-26 ENCOUNTER — Telehealth: Payer: Self-pay | Admitting: Pharmacy Technician

## 2024-03-26 ENCOUNTER — Other Ambulatory Visit (HOSPITAL_BASED_OUTPATIENT_CLINIC_OR_DEPARTMENT_OTHER): Payer: Self-pay

## 2024-03-26 NOTE — Telephone Encounter (Signed)
 Pharmacy Patient Advocate Encounter   Received notification from CoverMyMeds that prior authorization for Zepbound  15MG /0.5ML pen-injectors is required/requested.   Insurance verification completed.   The patient is insured through [facility] .   Per test claim: PA required; PA submitted to above mentioned insurance via CoverMyMeds Key/confirmation #/EOC [id number] Status is pending

## 2024-03-26 NOTE — Telephone Encounter (Signed)
 Pharmacy Patient Advocate Encounter  Received notification from [employer] that Prior Authorization for Zepbound  15MG /0.5ML pen-injectors has been APPROVED from 03/26/24 to 03/26/25. Ran test claim, Copay is $0.00. This test claim was processed through [facility]- copay amounts may vary at other pharmacies due to pharmacy/plan contracts, or as the patient moves through the different stages of their insurance plan.   PA #/Case ID/Reference #: [id number]

## 2024-03-27 ENCOUNTER — Ambulatory Visit: Payer: Self-pay | Admitting: Family Medicine

## 2024-04-12 ENCOUNTER — Encounter: Payer: Self-pay | Admitting: Family Medicine

## 2024-04-12 ENCOUNTER — Other Ambulatory Visit: Payer: Self-pay | Admitting: Family Medicine

## 2024-04-14 NOTE — Telephone Encounter (Signed)
 Name of Medication: Ambien  Name of [facility] [address] Dayla Garza or Written Date and Quantity: 12/13/23 #30 tabs/ 3 refill Last Office Visit and Type: acute appt on 03/06/24 Next Office Visit and Type: none scheduled

## 2024-04-14 NOTE — Telephone Encounter (Signed)
 Please schedule annual exam in November  Thanks

## 2024-04-14 NOTE — Telephone Encounter (Signed)
 Refill request has been received through the pharmacy

## 2024-04-25 ENCOUNTER — Other Ambulatory Visit: Payer: Self-pay | Admitting: Family Medicine

## 2024-05-02 ENCOUNTER — Other Ambulatory Visit: Payer: Self-pay | Admitting: Family Medicine

## 2024-05-02 MED ORDER — AMPHETAMINE-DEXTROAMPHETAMINE 10 MG PO TABS: ORAL_TABLET | ORAL | 0 refills | Status: DC

## 2024-05-02 NOTE — Telephone Encounter (Signed)
 Copied from CRM [id number]. Topic: Clinical - Medication Refill >> May 02, 2024  9:20 AM Ahlexyia S wrote: Medication:  amphetamine -dextroamphetamine  (ADDERALL) 10 MG tablet  Has the patient contacted their pharmacy? Yes, pt stated that pharmacy told her to call clinic. (Agent: If no, request that the patient contact the pharmacy for the refill. If patient does not wish to contact the pharmacy document the reason why and proceed with request.) (Agent: If yes, when and what did the pharmacy advise?)  This is the patient's preferred pharmacy:  [id number] - [address] [place] [address] Phone: [phone] Fax: [phone]  Is this the correct pharmacy for this prescription? Yes If no, delete pharmacy and type the correct one.   Has the prescription been filled recently? No  Is the patient out of the medication? Yes  Has the patient been seen for an appointment in the last year OR does the patient have an upcoming appointment? Yes  Can we respond through MyChart? Yes  Agent: Please be advised that Rx refills may take up to 3 business days. We ask that you follow-up with your pharmacy.

## 2024-05-02 NOTE — Telephone Encounter (Signed)
 Name of Medication: adderall Name of [facility]  Last Fill or Written Date and Quantity: 03/10/24 #60 tabs/ 0 refill Last Office Visit and Type: Discuss Bug bites 03/06/24 Next Office Visit and Type: CPE 06/25/24

## 2024-05-14 ENCOUNTER — Encounter: Payer: Self-pay | Admitting: Family Medicine

## 2024-05-14 NOTE — Telephone Encounter (Signed)
 Name of Medication: Ambien  Name of [facility]acy[facility] [address] Dayla Mazzoni or Written Date and Quantity: 04/14/24 #30 tabs/ 3 refill Last Office Visit and Type: acute appt on 03/06/24 Next Office Visit and Type: none scheduled

## 2024-06-16 ENCOUNTER — Telehealth: Payer: Self-pay | Admitting: Family Medicine

## 2024-06-16 ENCOUNTER — Other Ambulatory Visit: Payer: Self-pay | Admitting: Family Medicine

## 2024-06-16 DIAGNOSIS — Z13220 Encounter for screening for lipoid disorders: Secondary | ICD-10-CM

## 2024-06-16 DIAGNOSIS — Z0000 Encounter for general adult medical examination without abnormal findings: Secondary | ICD-10-CM

## 2024-06-16 DIAGNOSIS — E538 Deficiency of other specified B group vitamins: Secondary | ICD-10-CM

## 2024-06-16 DIAGNOSIS — Z131 Encounter for screening for diabetes mellitus: Secondary | ICD-10-CM

## 2024-06-16 DIAGNOSIS — E559 Vitamin D deficiency, unspecified: Secondary | ICD-10-CM

## 2024-06-16 DIAGNOSIS — Z79899 Other long term (current) drug therapy: Secondary | ICD-10-CM

## 2024-06-16 NOTE — Telephone Encounter (Signed)
-----   Message from Veva JINNY Ferrari sent at 06/05/2024 11:50 AM EDT ----- Regarding: Lab orders for Wed, 10.29.25 Patient is scheduled for CPX labs, please order future labs, Thanks , Veva

## 2024-06-17 ENCOUNTER — Other Ambulatory Visit (HOSPITAL_BASED_OUTPATIENT_CLINIC_OR_DEPARTMENT_OTHER): Payer: Self-pay

## 2024-06-17 ENCOUNTER — Encounter: Payer: Self-pay | Admitting: Family Medicine

## 2024-06-17 MED ORDER — ZEPBOUND 15 MG/0.5ML ~~LOC~~ SOAJ
15.0000 mg | SUBCUTANEOUS | 0 refills | Status: DC
  Filled 2024-06-17: qty 2, 28d supply, fill #0

## 2024-06-17 NOTE — Telephone Encounter (Signed)
 Last filled on 03/25/24 # 2 mL/ 2 refills   CPE scheduled 06/25/24  (Pt also sent a mychart message asking for refill please see message also)

## 2024-06-17 NOTE — Telephone Encounter (Signed)
 I sent the prescription Pt says in mychart she is still waiting on auth? Please check on that Thanks

## 2024-06-17 NOTE — Telephone Encounter (Signed)
 Pt just wanted Rx approved/ refilled. PCP has refilled, if a PA was needed our PA dpt will be notified

## 2024-06-17 NOTE — Telephone Encounter (Signed)
 Refill request was just sent yesterday by pharmacy, please see Rx request sent to you today

## 2024-06-18 ENCOUNTER — Other Ambulatory Visit (HOSPITAL_BASED_OUTPATIENT_CLINIC_OR_DEPARTMENT_OTHER): Payer: Self-pay

## 2024-06-18 ENCOUNTER — Ambulatory Visit: Payer: Self-pay | Admitting: Family Medicine

## 2024-06-18 ENCOUNTER — Other Ambulatory Visit

## 2024-06-18 DIAGNOSIS — Z13220 Encounter for screening for lipoid disorders: Secondary | ICD-10-CM

## 2024-06-18 DIAGNOSIS — E559 Vitamin D deficiency, unspecified: Secondary | ICD-10-CM | POA: Diagnosis not present

## 2024-06-18 DIAGNOSIS — Z0000 Encounter for general adult medical examination without abnormal findings: Secondary | ICD-10-CM | POA: Diagnosis not present

## 2024-06-18 DIAGNOSIS — Z79899 Other long term (current) drug therapy: Secondary | ICD-10-CM | POA: Diagnosis not present

## 2024-06-18 DIAGNOSIS — Z131 Encounter for screening for diabetes mellitus: Secondary | ICD-10-CM | POA: Diagnosis not present

## 2024-06-18 DIAGNOSIS — E538 Deficiency of other specified B group vitamins: Secondary | ICD-10-CM | POA: Diagnosis not present

## 2024-06-18 LAB — VITAMIN D 25 HYDROXY (VIT D DEFICIENCY, FRACTURES): VITD: 73.53 ng/mL (ref 30.00–100.00)

## 2024-06-18 LAB — COMPREHENSIVE METABOLIC PANEL WITH GFR
ALT: 11 U/L (ref 0–35)
AST: 11 U/L (ref 0–37)
Albumin: 4.3 g/dL (ref 3.5–5.2)
Alkaline Phosphatase: 46 U/L (ref 39–117)
BUN: 10 mg/dL (ref 6–23)
CO2: 26 meq/L (ref 19–32)
Calcium: 9.8 mg/dL (ref 8.4–10.5)
Chloride: 102 meq/L (ref 96–112)
Creatinine, Ser: 0.9 mg/dL (ref 0.40–1.20)
GFR: 77.94 mL/min (ref >60.00)
Glucose, Bld: 80 mg/dL (ref 70–99)
Potassium: 4.5 meq/L (ref 3.5–5.1)
Sodium: 137 meq/L (ref 135–145)
Total Bilirubin: 0.8 mg/dL (ref 0.2–1.2)
Total Protein: 6.8 g/dL (ref 6.0–8.3)

## 2024-06-18 LAB — CBC WITH DIFFERENTIAL/PLATELET
Basophils Absolute: 0 K/uL (ref 0.0–0.1)
Basophils Relative: 0.6 % (ref 0.0–3.0)
Eosinophils Absolute: 0.1 K/uL (ref 0.0–0.7)
Eosinophils Relative: 1.7 % (ref 0.0–5.0)
HCT: 39 % (ref 36.0–46.0)
Hemoglobin: 13.1 g/dL (ref 12.0–15.0)
Lymphocytes Relative: 31.6 % (ref 12.0–46.0)
Lymphs Abs: 1.8 K/uL (ref 0.7–4.0)
MCHC: 33.7 g/dL (ref 30.0–36.0)
MCV: 85 fl (ref 78.0–100.0)
Monocytes Absolute: 0.3 K/uL (ref 0.1–1.0)
Monocytes Relative: 5.7 % (ref 3.0–12.0)
Neutro Abs: 3.5 K/uL (ref 1.4–7.7)
Neutrophils Relative %: 60.4 % (ref 43.0–77.0)
Platelets: 217 K/uL (ref 150.0–400.0)
RBC: 4.59 Mil/uL (ref 3.87–5.11)
RDW: 14.2 % (ref 11.5–15.5)
WBC: 5.7 K/uL (ref 4.0–10.5)

## 2024-06-18 LAB — VITAMIN B12: Vitamin B-12: 693 pg/mL (ref 211–911)

## 2024-06-18 LAB — LIPID PANEL
Cholesterol: 165 mg/dL (ref 0–200)
HDL: 45.8 mg/dL (ref >39.00)
LDL Cholesterol: 99 mg/dL (ref 0–99)
NonHDL: 119.09
Total CHOL/HDL Ratio: 4
Triglycerides: 102 mg/dL (ref 0.0–149.0)
VLDL: 20.4 mg/dL (ref 0.0–40.0)

## 2024-06-18 LAB — HEMOGLOBIN A1C: Hgb A1c MFr Bld: 4.8 % (ref 4.6–6.5)

## 2024-06-18 LAB — TSH: TSH: 5.25 u[IU]/mL (ref 0.35–5.50)

## 2024-06-24 ENCOUNTER — Other Ambulatory Visit: Payer: Self-pay | Admitting: Family Medicine

## 2024-06-24 MED ORDER — AMPHETAMINE-DEXTROAMPHETAMINE 10 MG PO TABS: ORAL_TABLET | ORAL | 0 refills | Status: DC

## 2024-06-24 NOTE — Telephone Encounter (Signed)
 Copied from CRM [id number]. Topic: Clinical - Medication Refill >> Jun 24, 2024  8:54 AM Brittany M wrote: Medication: amphetamine -dextroamphetamine  (ADDERALL) 10 MG tablet  Has the patient contacted their pharmacy? Yes (Agent: If no, request that the patient contact the pharmacy for the refill. If patient does not wish to contact the pharmacy document the reason why and proceed with request.) (Agent: If yes, when and what did the pharmacy advise?)  This is the patient's preferred pharmacy:  [id number] [address] [facility] - [address] [place] [address] Phone: [phone] Fax: [phone]   Is this the correct pharmacy for this prescription? Yes If no, delete pharmacy and type the correct one.   Has the prescription been filled recently? Yes  Is the patient out of the medication? Yes  Has the patient been seen for an appointment in the last year OR does the patient have an upcoming appointment? Yes  Can we respond through MyChart? Yes  Agent: Please be advised that Rx refills may take up to 3 business days. We ask that you follow-up with your pharmacy.

## 2024-06-25 ENCOUNTER — Ambulatory Visit (INDEPENDENT_AMBULATORY_CARE_PROVIDER_SITE_OTHER): Admitting: Family Medicine

## 2024-06-25 ENCOUNTER — Encounter: Payer: Self-pay | Admitting: Family Medicine

## 2024-06-25 VITALS — BP 106/62 | HR 88 | Temp 98.2°F | Ht 65.75 in | Wt 153.1 lb

## 2024-06-25 DIAGNOSIS — E559 Vitamin D deficiency, unspecified: Secondary | ICD-10-CM | POA: Diagnosis not present

## 2024-06-25 DIAGNOSIS — E538 Deficiency of other specified B group vitamins: Secondary | ICD-10-CM

## 2024-06-25 DIAGNOSIS — F900 Attention-deficit hyperactivity disorder, predominantly inattentive type: Secondary | ICD-10-CM

## 2024-06-25 DIAGNOSIS — G4700 Insomnia, unspecified: Secondary | ICD-10-CM

## 2024-06-25 DIAGNOSIS — Z0000 Encounter for general adult medical examination without abnormal findings: Secondary | ICD-10-CM | POA: Diagnosis not present

## 2024-06-25 DIAGNOSIS — K2100 Gastro-esophageal reflux disease with esophagitis, without bleeding: Secondary | ICD-10-CM | POA: Diagnosis not present

## 2024-06-25 DIAGNOSIS — Z8639 Personal history of other endocrine, nutritional and metabolic disease: Secondary | ICD-10-CM

## 2024-06-25 DIAGNOSIS — Z79899 Other long term (current) drug therapy: Secondary | ICD-10-CM

## 2024-06-25 DIAGNOSIS — Z131 Encounter for screening for diabetes mellitus: Secondary | ICD-10-CM

## 2024-06-25 NOTE — Assessment & Plan Note (Signed)
 Has lost significant weight on zepbound  (15 mg weekly) Tolerates well Excellent health habits and exercise   Continue  Bmi under 25 Would hold/dec dose if significantly more weight loss

## 2024-06-25 NOTE — Assessment & Plan Note (Signed)
 Last vitamin D  Lab Results  Component Value Date   VD25OH 73.53 06/18/2024   Vitamin D  level is therapeutic with current supplementation Disc importance of this to bone and overall health  On ppi

## 2024-06-25 NOTE — Progress Notes (Signed)
 Subjective:    Patient ID: Powell Hope, female    DOB: 1981-03-29, 43 y.o.   MRN: [id number]  HPI  Here for health maintenance exam and to review chronic medical problems   Wt Readings from Last 3 Encounters:  06/25/24 153 lb 2 oz (69.5 kg)  03/06/24 184 lb 6 oz (83.6 kg)  12/27/23 209 lb 8 oz (95 kg)   24.90 kg/m  Vitals:   06/25/24 0817  BP: 106/62  Pulse: 88  Temp: 98.2 F (36.8 C)  SpO2: 99%    Immunization History  Administered Date(s) Administered   Influenza,inj,Quad PF,6+ Mos 05/31/2020   Influenza-Unspecified 06/05/2015, 05/24/2021, 05/20/2022, 05/06/2023, 04/30/2024   PFIZER(Purple Top)SARS-COV-2 Vaccination 10/08/2019, 10/30/2019, 06/09/2020   Td 10/20/1995   Tdap 01/17/2017    Health Maintenance Due  Topic Date Due   Hepatitis C Screening  Never done   Hepatitis B Vaccines 19-59 Average Risk (1 of 3 - 19+ 3-dose series) Never done   HPV VACCINES (1 - 3-dose SCDM series) Never done   Cervical Cancer Screening (HPV/Pap Cotest)  06/22/2021   Has shoulder surgery Done with therapy -did very well   Mammogram 03/2024 Self breast exam-no lumps   Gyn health Pap 06/2018 - gyn / plans to follow  OC 0.15-30  Does well for this  Regular periods     Colon cancer screening  No family history   Bone health  Falls- none  Fractures-none  Supplements -vitamin D  Last vitamin D  Lab Results  Component Value Date   VD25OH 73.53 06/18/2024    Exercise  Walking and running -twice daily Works out after work with friend strength training and some yoga)     Mood    06/25/2024    8:27 AM 03/06/2024    8:07 AM 12/27/2023    8:05 AM 07/09/2023    3:08 PM 11/06/2022    8:15 AM  Depression screen PHQ 2/9  Decreased Interest 0 0 0 0 0  Down, Depressed, Hopeless 0 0 0 0 0  PHQ - 2 Score 0 0 0 0 0  Altered sleeping 0 1 1 0 0  Tired, decreased energy 1 1 1 1 1   Change in appetite 0 0 0 0 0  Feeling bad or failure about yourself  0 0 0 0 0  Trouble  concentrating 0 0 0 0 0  Moving slowly or fidgety/restless 0 0 0 0 0  Suicidal thoughts 0 0 0 0 0  PHQ-9 Score 1 2 2 1 1   Difficult doing work/chores Not difficult at all Not difficult at all Not difficult at all Not difficult at all Not difficult at all   ADD Adderall 10 mg bid  Sleep  Ambien  10 mg at bedtime prn    GERD- well ocntrolled  Omeprazole  20 mg daily  Lab Results  Component Value Date   VITAMINB12 693 06/18/2024  Taking B12     Zepbound  15 mg weekly for former obesity  Has done very well  Bmi is under 25  Price will go up some after jan 1   Plantar fasciitis is improved    Cholesterol Lab Results  Component Value Date   CHOL 165 06/18/2024   CHOL 140 07/09/2023   CHOL 149 02/14/2022   Lab Results  Component Value Date   HDL 45.80 06/18/2024   HDL 46.00 07/09/2023   HDL 35 (L) 02/14/2022   Lab Results  Component Value Date   LDLCALC 99 06/18/2024   LDLCALC 80 07/09/2023  LDLCALC 84 02/14/2022   Lab Results  Component Value Date   TRIG 102.0 06/18/2024   TRIG 74.0 07/09/2023   TRIG 152 (H) 02/14/2022   Lab Results  Component Value Date   CHOLHDL 4 06/18/2024   CHOLHDL 3 07/09/2023   CHOLHDL 4.3 02/14/2022   No results found for: LDLDIRECT  Lab Results  Component Value Date   NA 137 06/18/2024   K 4.5 06/18/2024   CO2 26 06/18/2024   GLUCOSE 80 06/18/2024   BUN 10 06/18/2024   CREATININE 0.90 06/18/2024   CALCIUM 9.8 06/18/2024   GFR 77.94 06/18/2024   GFRNONAA >60 08/13/2022   Lab Results  Component Value Date   ALT 11 06/18/2024   AST 11 06/18/2024   ALKPHOS 46 06/18/2024   BILITOT 0.8 06/18/2024   Lab Results  Component Value Date   HGBA1C 4.8 06/18/2024   HGBA1C 5.1 07/09/2023   Lab Results  Component Value Date   TSH 5.25 06/18/2024   Lab Results  Component Value Date   WBC 5.7 06/18/2024   HGB 13.1 06/18/2024   HCT 39.0 06/18/2024   MCV 85.0 06/18/2024   PLT 217.0 06/18/2024      Patient Active  Problem List   Diagnosis Date Noted   Diabetes mellitus screening 07/09/2023   Screening for lipoid disorders 07/09/2023   Vitamin D  deficiency 03/08/2022   Elevated troponin 02/14/2022   Routine general medical examination at a health care facility 06/07/2021   Current use of proton pump inhibitor 06/07/2021   Encounter for screening mammogram for breast cancer 06/07/2021   Vitamin B12 deficiency 06/07/2021   Encounter for screening for lipoid disorders 05/31/2020   History of obesity 03/03/2020   Cubital tunnel syndrome on right 08/22/2018   GERD (gastroesophageal reflux disease) 01/01/2018   H/O migraine 10/11/2015   ADD (attention deficit disorder) 09/03/2014   Insomnia 09/02/2012   Past Medical History:  Diagnosis Date   ADD (attention deficit disorder)    Elevated BP without diagnosis of hypertension    Elevated TSH    GERD (gastroesophageal reflux disease)    Insomnia    Migraines    Past Surgical History:  Procedure Laterality Date   CESAREAN SECTION     LEFT HEART CATH AND CORONARY ANGIOGRAPHY N/A 02/14/2022   Procedure: LEFT HEART CATH AND CORONARY ANGIOGRAPHY;  Surgeon: Mady Bruckner, MD;  Location: [facility] INVASIVE CV LAB;  Service: Cardiovascular;  Laterality: N/A;   Social History   Tobacco Use   Smoking status: Never   Smokeless tobacco: Never  Substance Use Topics   Alcohol use: Yes    Alcohol/week: 0.0 standard drinks of alcohol    Comment: occasional    Drug use: No   Family History  Problem Relation Age of Onset   Healthy Mother    Cancer Father    No Known Allergies Current Outpatient Medications on File Prior to Visit  Medication Sig Dispense Refill   amphetamine -dextroamphetamine  (ADDERALL) 10 MG tablet Take one pill by mouth every am and one at lunchtime 60 tablet 0   clobetasol  ointment (TEMOVATE ) 0.05 % Apply 1 Application topically 2 (two) times daily as needed (to affected area). 30 g 0   levonorgestrel -ethinyl estradiol (ALTAVERA)  0.15-30 MG-MCG tablet TAKE 1 TABLET BY MOUTH EVERY DAY 28 tablet 5   omeprazole  (PRILOSEC) 20 MG capsule TAKE 1 CAPSULE BY MOUTH EVERY DAY 90 capsule 1   tirzepatide  (ZEPBOUND ) 10 MG/0.5ML Pen Inject 10 mg into the skin once a week. 2  mL 0   tirzepatide  (ZEPBOUND ) 15 MG/0.5ML Pen Inject 15 mg into the skin once a week. 2 mL 0   zolpidem  (AMBIEN ) 10 MG tablet TAKE 1 TABLET BY MOUTH AT BEDTIME AS NEEDED FOR SLEEP. 30 tablet 3   No current facility-administered medications on file prior to visit.    Review of Systems  Constitutional:  Negative for activity change, appetite change, fatigue, fever and unexpected weight change.  HENT:  Negative for congestion, ear pain, rhinorrhea, sinus pressure and sore throat.   Eyes:  Negative for pain, redness and visual disturbance.  Respiratory:  Negative for cough, shortness of breath and wheezing.   Cardiovascular:  Negative for chest pain and palpitations.  Gastrointestinal:  Negative for abdominal pain, blood in stool, constipation and diarrhea.  Endocrine: Negative for polydipsia and polyuria.  Genitourinary:  Negative for dysuria, frequency and urgency.  Musculoskeletal:  Negative for arthralgias, back pain and myalgias.  Skin:  Negative for pallor and rash.  Allergic/Immunologic: Negative for environmental allergies.  Neurological:  Negative for dizziness, syncope and headaches.  Hematological:  Negative for adenopathy. Does not bruise/bleed easily.  Psychiatric/Behavioral:  Negative for decreased concentration and dysphoric mood. The patient is not nervous/anxious.        Objective:   Physical Exam Constitutional:      General: She is not in acute distress.    Appearance: Normal appearance. She is well-developed and normal weight. She is not ill-appearing or diaphoretic.  HENT:     Head: Normocephalic and atraumatic.     Right Ear: Tympanic membrane, ear canal and external ear normal.     Left Ear: Tympanic membrane, ear canal and external  ear normal.     Nose: Nose normal. No congestion.     Mouth/Throat:     Mouth: Mucous membranes are moist.     Pharynx: Oropharynx is clear. No posterior oropharyngeal erythema.  Eyes:     General: No scleral icterus.    Extraocular Movements: Extraocular movements intact.     Conjunctiva/sclera: Conjunctivae normal.     Pupils: Pupils are equal, round, and reactive to light.  Neck:     Thyroid: No thyromegaly.     Vascular: No carotid bruit or JVD.  Cardiovascular:     Rate and Rhythm: Normal rate and regular rhythm.     Pulses: Normal pulses.     Heart sounds: Normal heart sounds.     No gallop.  Pulmonary:     Effort: Pulmonary effort is normal. No respiratory distress.     Breath sounds: Normal breath sounds. No wheezing.     Comments: Good air exch Chest:     Chest wall: No tenderness.  Abdominal:     General: Bowel sounds are normal. There is no distension or abdominal bruit.     Palpations: Abdomen is soft. There is no mass.     Tenderness: There is no abdominal tenderness.     Hernia: No hernia is present.  Genitourinary:    Comments: Breast exam: No mass, nodules, thickening, tenderness, bulging, retraction, inflamation, nipple discharge or skin changes noted.  No axillary or clavicular LA.     Musculoskeletal:        General: No tenderness. Normal range of motion.     Cervical back: Normal range of motion and neck supple. No rigidity. No muscular tenderness.     Right lower leg: No edema.     Left lower leg: No edema.     Comments: No kyphosis  Lymphadenopathy:     Cervical: No cervical adenopathy.  Skin:    General: Skin is warm and dry.     Coloration: Skin is not pale.     Findings: No erythema or rash.     Comments: Solar lentigines diffusely  Brown nevi on back of different sizes/baseline No rash  Neurological:     Mental Status: She is alert. Mental status is at baseline.     Cranial Nerves: No cranial nerve deficit.     Motor: No abnormal muscle  tone.     Coordination: Coordination normal.     Gait: Gait normal.     Deep Tendon Reflexes: Reflexes are normal and symmetric. Reflexes normal.  Psychiatric:        Attention and Perception: Attention normal.        Mood and Affect: Mood normal.        Cognition and Memory: Cognition and memory normal.           Assessment & Plan:   Problem List Items Addressed This Visit       Digestive   GERD (gastroesophageal reflux disease)   Well controlled with omerpazole 20 mg daily  With weight loss as well   Encouraged to try dosing every other day and see if she has rebound symptoms  Would like to wean if able to  Report back if worse   Encouraged to avoid triggers  B12 and D levels are good         Other   Vitamin D  deficiency   Last vitamin D  Lab Results  Component Value Date   VD25OH 73.53 06/18/2024   Vitamin D  level is therapeutic with current supplementation Disc importance of this to bone and overall health  On ppi      Vitamin B12 deficiency   Lab Results  Component Value Date   VITAMINB12 693 06/18/2024   On ppi Continues to supplement       Routine general medical examination at a health care facility - Primary   Reviewed health habits including diet and exercise and skin cancer prevention Reviewed appropriate screening tests for age  Also reviewed health mt list, fam hx and immunization status , as well as social and family history   See HPI Labs reviewed and ordered Health Maintenance  Topic Date Due   Hepatitis C Screening  Never done   Hepatitis B Vaccine (1 of 3 - 19+ 3-dose series) Never done   HPV Vaccine (1 - 3-dose SCDM series) Never done   Pap with HPV screening  06/22/2021   COVID-19 Vaccine (4 - 2025-26 season) 07/11/2025*   Breast Cancer Screening  03/25/2025   DTaP/Tdap/Td vaccine (3 - Td or Tdap) 01/18/2027   Flu Shot  Completed   HIV Screening  Completed   Pneumococcal Vaccine  Aged Out   Meningitis B Vaccine  Aged Out   *Topic was postponed. The date shown is not the original due date.    On OC, plans to follow up with gyn (Dr Rutherford in past) for her pap  Normal breast exam today  Will discuss colon cancer screen at 44 Discussed fall prevention, supplements and exercise for bone density  Great health habits PHQ 1       Insomnia   Continues ambien  prn No side effects and works well   Understands sedation risk       History of obesity   Has lost significant weight on zepbound  (15 mg weekly) Tolerates well Excellent  health habits and exercise   Continue  Bmi under 25 Would hold/dec dose if significantly more weight loss       Diabetes mellitus screening   Lab Results  Component Value Date   HGBA1C 4.8 06/18/2024   HGBA1C 5.1 07/09/2023   Down more with weight loss and glp-1       Current use of proton pump inhibitor   Lab Results  Component Value Date   VITAMINB12 693 06/18/2024   Last vitamin D  Lab Results  Component Value Date   VD25OH 73.53 06/18/2024   Supplementing   With weight loss may be able to start weaning medicine -discussed this       ADD (attention deficit disorder)   Continues to do well with adderall 10 mg  No problems  Performing well at work

## 2024-06-25 NOTE — Assessment & Plan Note (Signed)
 Reviewed health habits including diet and exercise and skin cancer prevention Reviewed appropriate screening tests for age  Also reviewed health mt list, fam hx and immunization status , as well as social and family history   See HPI Labs reviewed and ordered Health Maintenance  Topic Date Due   Hepatitis C Screening  Never done   Hepatitis B Vaccine (1 of 3 - 19+ 3-dose series) Never done   HPV Vaccine (1 - 3-dose SCDM series) Never done   Pap with HPV screening  06/22/2021   COVID-19 Vaccine (4 - 2025-26 season) 07/11/2025*   Breast Cancer Screening  03/25/2025   DTaP/Tdap/Td vaccine (3 - Td or Tdap) 01/18/2027   Flu Shot  Completed   HIV Screening  Completed   Pneumococcal Vaccine  Aged Out   Meningitis B Vaccine  Aged Out  *Topic was postponed. The date shown is not the original due date.    On OC, plans to follow up with gyn (Dr Rutherford in past) for her pap  Normal breast exam today  Will discuss colon cancer screen at 44 Discussed fall prevention, supplements and exercise for bone density  Great health habits PHQ 1

## 2024-06-25 NOTE — Assessment & Plan Note (Signed)
 Lab Results  Component Value Date   VITAMINB12 693 06/18/2024   Last vitamin D  Lab Results  Component Value Date   VD25OH 73.53 06/18/2024   Supplementing   With weight loss may be able to start weaning medicine -discussed this

## 2024-06-25 NOTE — Assessment & Plan Note (Signed)
 Continues to do well with adderall 10 mg  No problems  Performing well at work

## 2024-06-25 NOTE — Assessment & Plan Note (Signed)
 Lab Results  Component Value Date   HGBA1C 4.8 06/18/2024   HGBA1C 5.1 07/09/2023   Down more with weight loss and glp-1

## 2024-06-25 NOTE — Assessment & Plan Note (Signed)
 Well controlled with omerpazole 20 mg daily  With weight loss as well   Encouraged to try dosing every other day and see if she has rebound symptoms  Would like to wean if able to  Report back if worse   Encouraged to avoid triggers  B12 and D levels are good

## 2024-06-25 NOTE — Assessment & Plan Note (Signed)
 Continues ambien  prn No side effects and works well   Understands sedation risk

## 2024-06-25 NOTE — Assessment & Plan Note (Signed)
 Lab Results  Component Value Date   VITAMINB12 693 06/18/2024   On ppi Continues to supplement

## 2024-06-25 NOTE — Patient Instructions (Addendum)
 Get your gyn appointment  If you need a referral let us  know   You can try going down on omeprazole  to every other day  If your acid reflux gets worse and does not improve in several weeks go back to daily  Keep us  posted   Keep up the great work with diet and exercise   Keep eating a healthy diet  Avoid added sugars in your diet when you can  Try to get most of your carbohydrates from produce (with the exception of white potatoes) and whole grains Eat less bread/pasta/rice/snack foods/cereals/sweets and other items from the middle of the grocery store (processed carbs)

## 2024-07-14 ENCOUNTER — Other Ambulatory Visit: Payer: Self-pay | Admitting: Family Medicine

## 2024-07-16 ENCOUNTER — Other Ambulatory Visit (HOSPITAL_BASED_OUTPATIENT_CLINIC_OR_DEPARTMENT_OTHER): Payer: Self-pay

## 2024-07-16 ENCOUNTER — Encounter: Payer: Self-pay | Admitting: Family Medicine

## 2024-07-16 ENCOUNTER — Encounter (HOSPITAL_BASED_OUTPATIENT_CLINIC_OR_DEPARTMENT_OTHER): Payer: Self-pay

## 2024-07-16 MED ORDER — ZEPBOUND 15 MG/0.5ML ~~LOC~~ SOAJ
15.0000 mg | SUBCUTANEOUS | 5 refills | Status: AC
  Filled 2024-07-16: qty 2, 28d supply, fill #0
  Filled 2024-08-14: qty 2, 28d supply, fill #1

## 2024-07-16 NOTE — Telephone Encounter (Signed)
 Last filled on 06/17/24 # 40mL/ 0 refills   CPE 06/25/24

## 2024-08-12 ENCOUNTER — Other Ambulatory Visit: Payer: Self-pay | Admitting: Family Medicine

## 2024-09-02 ENCOUNTER — Encounter: Payer: Self-pay | Admitting: Family Medicine

## 2024-09-02 ENCOUNTER — Other Ambulatory Visit: Payer: Self-pay | Admitting: Family Medicine

## 2024-09-02 NOTE — Telephone Encounter (Signed)
 Please just let me know what she needs me to do -thanks  If follow up is needed, please schedule

## 2024-09-02 NOTE — Telephone Encounter (Unsigned)
 Copied from CRM #[id number]. Topic: Clinical - Medication Refill >> Sep 02, 2024  8:22 AM Emylou G wrote: Medication: amphetamine -dextroamphetamine  (ADDERALL) 10 MG tablet  Has the patient contacted their pharmacy? No (Agent: If no, request that the patient contact the pharmacy for the refill. If patient does not wish to contact the pharmacy document the reason why and proceed with request.) (Agent: If yes, when and what did the pharmacy advise?)  This is the patient's preferred pharmacy:  [id number] - [address] [place] [address] Phone: [phone] Fax: [phone]   Is this the correct pharmacy for this prescription? Yes If no, delete pharmacy and type the correct one.   Has the prescription been filled recently? No  Is the patient out of the medication? Yes  Has the patient been seen for an appointment in the last year OR does the patient have an upcoming appointment? Yes  Can we respond through MyChart? Yes  Agent: Please be advised that Rx refills may take up to 3 business days. We ask that you follow-up with your pharmacy.

## 2024-09-03 MED ORDER — AMPHETAMINE-DEXTROAMPHETAMINE 10 MG PO TABS: ORAL_TABLET | ORAL | 0 refills | Status: AC

## 2024-09-03 NOTE — Telephone Encounter (Signed)
 Name of Medication: adderall Name of Pharmacy: [facility]  Last Fill or Written Date and Quantity: 06/24/24 #60 tabs/ 0 refill Last Office Visit and Type: CPE 06/25/24 Next Office Visit and Type: none scheduled

## 2024-09-04 ENCOUNTER — Encounter: Payer: Self-pay | Admitting: Pharmacist

## 2024-09-04 NOTE — Progress Notes (Signed)
 Chart Review Reason: Drug information Question - Patient question regarding Zepbound  Prior Auth renewal   Summary: Initiated prior auth as to determine eligibility criteria for ongoing maintenance of controlled obesity on Zepbound .   Per Cover My Meds, This medication or product was previously approved on [id number] from 2024-03-26 to 2025-03-26.  Informed PCP.    Manuelita FABIENE Kobs, PharmD, BCACP, CPP Clinical Pharmacist Practitioner [facility] Ph: [phone]

## 2024-09-04 NOTE — Telephone Encounter (Signed)
 Manuelita- will insurance companies generally take into account the obese patient's starting weight/bmi for glp-1 ?  I cannot mis represent her current numbers.   Do most ins co just stop paying once someone is no longer obese?   Thanks -you may not be able to answer but curious if you knew

## 2024-09-06 ENCOUNTER — Other Ambulatory Visit: Payer: Self-pay | Admitting: Family Medicine

## 2024-09-07 NOTE — Telephone Encounter (Signed)
 Please let pt know (thanks Manuelita) Maybe she will not need the auth   Let me know what I need to do next

## 2024-09-11 ENCOUNTER — Other Ambulatory Visit (HOSPITAL_BASED_OUTPATIENT_CLINIC_OR_DEPARTMENT_OTHER): Payer: Self-pay
